# Patient Record
Sex: Male | Born: 1975
Health system: Southern US, Community
[De-identification: ages and names within clinical notes are randomized; demographics above are authoritative.]

## PROBLEM LIST (undated history)

## (undated) DIAGNOSIS — K5190 Ulcerative colitis, unspecified, without complications: Secondary | ICD-10-CM

## (undated) HISTORY — DX: Ulcerative colitis, unspecified, without complications: K51.90

---
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
## 1997-07-30 HISTORY — PX: WISDOM TOOTH EXTRACTION: SHX21

## 2015-02-24 ENCOUNTER — Ambulatory Visit (INDEPENDENT_AMBULATORY_CARE_PROVIDER_SITE_OTHER): Payer: Managed Care, Other (non HMO) | Admitting: Family Medicine

## 2015-02-24 ENCOUNTER — Encounter: Payer: Self-pay | Admitting: Family Medicine

## 2015-02-24 VITALS — BP 128/95 | HR 84 | Ht 75.0 in | Wt 243.0 lb

## 2015-02-24 DIAGNOSIS — N529 Male erectile dysfunction, unspecified: Secondary | ICD-10-CM

## 2015-02-24 DIAGNOSIS — M222X2 Patellofemoral disorders, left knee: Secondary | ICD-10-CM

## 2015-02-24 MED ORDER — MELOXICAM 15 MG PO TABS
15.0000 mg | ORAL_TABLET | Freq: Every day | ORAL | Status: DC
Start: 2015-02-24 — End: 2016-04-09

## 2015-02-24 MED ORDER — SILDENAFIL CITRATE 20 MG PO TABS: ORAL_TABLET | ORAL | Status: DC

## 2015-02-24 NOTE — Progress Notes (Signed)
CC: Charles Novak is a 39 y.o. male is here for Establish Care and left knee pain   Subjective: HPI:  Very pleasant 39 year old here to establish care   Complains of left knee pain present for the last 5 days. Present on a daily basis. It's worse when going from a seated to a standing position or when going up or down stairs. He tells me it feels like it's beneath the kneecap. It is nonradiating. He denies any recent or remote trauma or overexertion. He denies swelling redness or warmth of the joint. He denies locking catching or giving way. No interventions as of yet.  Complains of difficulty maintaining an erection that has been going on for a few months now. He denies any other genitourinary complaints. He has no difficulty with initiating erection however since he tries to put on a condom and gets limp and condom falls off. He denies exertional chest pain or burning when he urinates. He denies any change in libido.  He denies any penile lesions. No interventions as of yet  Review of Systems - General ROS: negative for - chills, fever, night sweats, weight gain or weight loss Ophthalmic ROS: negative for - decreased vision Psychological ROS: negative for - anxiety or depression ENT ROS: negative for - hearing change, nasal congestion, tinnitus or allergies Hematological and Lymphatic ROS: negative for - bleeding problems, bruising or swollen lymph nodes Breast ROS: negative Respiratory ROS: no cough, shortness of breath, or wheezing Cardiovascular ROS: no chest pain or dyspnea on exertion Gastrointestinal ROS: no abdominal pain, change in bowel habits, or black or bloody stools Genito-Urinary ROS: negative for - genital discharge, genital ulcers, incontinence or abnormal bleeding from genitals Musculoskeletal ROS: negative for - joint pain or muscle pain other than that described above Neurological ROS: negative for - headaches or memory loss Dermatological ROS: negative for lumps, mole  changes, rash and skin lesion changes  Past Medical History  Diagnosis Date  . Ulcerative colitis 1991    Past Surgical History  Procedure Laterality Date  . Wisdom tooth extraction  07/1997   Family History  Problem Relation Age of Onset  . Leukemia      grandmother   . Heart attack      grandfather  . Depression      brother   . Diabetes      parents  . Hyperlipidemia Father   . Hypertension Father     History   Social History  . Marital Status: Single    Spouse Name: N/A  . Number of Children: N/A  . Years of Education: N/A   Occupational History  . Not on file.   Social History Main Topics  . Smoking status: Former Smoker    Quit date: 04/25/2008  . Smokeless tobacco: Not on file  . Alcohol Use: 1.8 - 3.0 oz/week    3-5 Standard drinks or equivalent per week  . Drug Use: No  . Sexual Activity:    Partners: Female   Other Topics Concern  . Not on file   Social History Narrative  . No narrative on file     Objective: BP 128/95 mmHg  Pulse 84  Ht 6\' 3"  (1.905 m)  Wt 243 lb (110.224 kg)  BMI 30.37 kg/m2  General: Alert and Oriented, No Acute Distress HEENT: Pupils equal, round, reactive to light. Conjunctivae clear.  Moist mucous membranes Lungs: Clear to auscultation bilaterally, no wheezing/ronchi/rales.  Comfortable work of breathing. Good air movement. Cardiac: Regular rate  and rhythm. Normal S1/S2.  No murmurs, rubs, nor gallops.   Left knee exam shows full-strength and range of motion. There is no swelling, redness, nor warmth overlying the knee.  No patellar crepitus. No patellar apprehension. No pain with palpation of the inferior patellar pole.  No pain or laxity with valgus nor varus stress. Anterior drawer is negative. McMurray's negative. No popliteal space tenderness or palpable mass. No medial or lateral joint line tenderness to palpation. Extremities: No peripheral edema.  Strong peripheral pulses.  Mental Status: No depression, anxiety,  nor agitation. Skin: Warm and dry.  Assessment & Plan: Sherrod was seen today for establish care and left knee pain.  Diagnoses and all orders for this visit:  Patella-femoral syndrome, left Orders: -     meloxicam (MOBIC) 15 MG tablet; Take 1 tablet (15 mg total) by mouth daily.  Erectile dysfunction, unspecified erectile dysfunction type Orders: -     sildenafil (REVATIO) 20 MG tablet; 2-4 by mouth only as needed for sex   Patellofemoral syndrome of the left knee: Start meloxicam and begin home rehabilitation exercises that he was provided today, perform daily for at least three weeks. Erectile dysfunction: Begin generic Viagra, he was provided with a prescription and the address of [facility]  Return if symptoms worsen or fail to improve.

## 2015-02-27 ENCOUNTER — Other Ambulatory Visit: Payer: Self-pay | Admitting: Family Medicine

## 2015-02-27 DIAGNOSIS — K5190 Ulcerative colitis, unspecified, without complications: Secondary | ICD-10-CM | POA: Insufficient documentation

## 2015-09-08 ENCOUNTER — Other Ambulatory Visit: Payer: Self-pay | Admitting: Family Medicine

## 2015-09-08 LAB — CBC AND DIFFERENTIAL
Hemoglobin: 16.7 g/dL (ref 13.5–17.5)
Platelets: 228 10*3/uL (ref 150–399)
WBC: 8.4 10^3/mL

## 2015-09-08 LAB — HEPATIC FUNCTION PANEL
ALT: 32 U/L (ref 10–40)
AST: 34 U/L (ref 14–40)

## 2015-09-08 LAB — LIPID PANEL
CHOLESTEROL: 198 mg/dL (ref 0–200)
HDL: 22 mg/dL — AB (ref 35–70)
TRIGLYCERIDES: 609 mg/dL — AB (ref 40–160)

## 2015-09-08 LAB — BASIC METABOLIC PANEL
CREATININE: 1.1 mg/dL (ref 0.6–1.3)
Glucose: 96 mg/dL
POTASSIUM: 4.6 mmol/L (ref 3.4–5.3)

## 2015-09-10 ENCOUNTER — Encounter: Payer: Self-pay | Admitting: Family Medicine

## 2015-09-10 DIAGNOSIS — E785 Hyperlipidemia, unspecified: Secondary | ICD-10-CM | POA: Insufficient documentation

## 2015-09-24 ENCOUNTER — Encounter: Payer: Self-pay | Admitting: Family Medicine

## 2016-04-09 ENCOUNTER — Ambulatory Visit (INDEPENDENT_AMBULATORY_CARE_PROVIDER_SITE_OTHER): Payer: BLUE CROSS/BLUE SHIELD

## 2016-04-09 ENCOUNTER — Ambulatory Visit (INDEPENDENT_AMBULATORY_CARE_PROVIDER_SITE_OTHER): Payer: BLUE CROSS/BLUE SHIELD | Admitting: Family Medicine

## 2016-04-09 ENCOUNTER — Encounter: Payer: Self-pay | Admitting: Family Medicine

## 2016-04-09 VITALS — BP 123/82 | HR 90 | Wt 247.0 lb

## 2016-04-09 DIAGNOSIS — G5752 Tarsal tunnel syndrome, left lower limb: Secondary | ICD-10-CM

## 2016-04-09 DIAGNOSIS — M25572 Pain in left ankle and joints of left foot: Secondary | ICD-10-CM

## 2016-04-09 DIAGNOSIS — M25872 Other specified joint disorders, left ankle and foot: Secondary | ICD-10-CM

## 2016-04-09 NOTE — Progress Notes (Signed)
CC: Charles Novak is a 40 y.o. male is here for Foot Pain (left foot and ankle pain)   Subjective: HPI:  Ever since March of this year he's been experiencing some left lateral ankle pain. It was at its worst is March and he was seen at a urgent care center and was diagnosed with gout. He questions this diagnosis. Improved temporarily with prednisone. Slowly coming back. It hurts to bear weight and with eversion. He denies any known trauma or overexertion. Occasionally notices swelling but no redness. Has never had this before. Denies any other joint pain. Has no other complaints today. Symptoms are persistent and mild to moderate in severity.   Review Of Systems Outlined In HPI  Past Medical History:  Diagnosis Date  . Ulcerative colitis 1991    Past Surgical History:  Procedure Laterality Date  . WISDOM TOOTH EXTRACTION  07/1997   Family History  Problem Relation Age of Onset  . Leukemia      grandmother   . Heart attack      grandfather  . Depression      brother   . Diabetes      parents  . Hyperlipidemia Father   . Hypertension Father     Social History   Social History  . Marital status: Single    Spouse name: N/A  . Number of children: N/A  . Years of education: N/A   Occupational History  . Not on file.   Social History Main Topics  . Smoking status: Former Smoker    Quit date: 04/25/2008  . Smokeless tobacco: Not on file  . Alcohol use 1.8 - 3.0 oz/week    3 - 5 Standard drinks or equivalent per week  . Drug use: No  . Sexual activity: Not Currently    Partners: Female   Other Topics Concern  . Not on file   Social History Narrative  . No narrative on file     Objective: BP 123/82   Pulse (!) 120   Wt 247 lb (112 kg)   BMI 30.87 kg/m   Vital signs reviewed. General: Alert and Oriented, No Acute Distress HEENT: Pupils equal, round, reactive to light. Conjunctivae clear.  External ears unremarkable.  Moist mucous membranes. Lungs: Clear and  comfortable work of breathing, speaking in full sentences without accessory muscle use. Cardiac: Regular rate and rhythm.  Neuro: CN II-XII grossly intact, gait normal. Extremities: No peripheral edema.  Strong peripheral pulses.  Left foot exam: No pain at the medial or lateral malleoli. He does have a little bit of pain reproduced with palpation just posterior to the ATF, no pain with passive inversion however pain is reproduced with passive eversion. No pain of the Achilles tendon nor at the insertion of the calcaneus Mental Status: No depression, anxiety, nor agitation. Logical though process. Skin: Warm and dry.    Assessment & Plan: Quamir was seen today for foot pain.  Diagnoses and all orders for this visit:  Left ankle pain -     DG Ankle Complete Left    Personal interpretation of his x-rays show a bone cyst above the left lateral malleolus. This is far from where he is having his pain is unlikely causing his pain. Radiology has recommended an MRI of clinically indicated, at this point I don't think we need to get the MRI and I would rather him try a trial of custom orthotics from one of our sports medicine physicians.   No Follow-up on file.

## 2016-04-16 ENCOUNTER — Ambulatory Visit (INDEPENDENT_AMBULATORY_CARE_PROVIDER_SITE_OTHER): Payer: BLUE CROSS/BLUE SHIELD | Admitting: Sports Medicine

## 2016-04-16 DIAGNOSIS — G5752 Tarsal tunnel syndrome, left lower limb: Secondary | ICD-10-CM | POA: Diagnosis not present

## 2016-04-16 DIAGNOSIS — M25572 Pain in left ankle and joints of left foot: Secondary | ICD-10-CM | POA: Insufficient documentation

## 2016-04-16 MED ORDER — MELOXICAM 15 MG PO TABS: ORAL_TABLET | ORAL | 3 refills | Status: DC

## 2016-04-16 NOTE — Assessment & Plan Note (Signed)
Custom orthotics as above, return in one month to recheck, consider talocrural injection of no better.

## 2016-04-16 NOTE — Progress Notes (Signed)

## 2016-04-16 NOTE — Addendum Note (Signed)
Addended by: Silverio Decamp on: 04/16/2016 10:16 AM   Modules accepted: Orders

## 2016-05-17 ENCOUNTER — Other Ambulatory Visit: Payer: Self-pay

## 2016-05-17 MED ORDER — MELOXICAM 15 MG PO TABS: ORAL_TABLET | ORAL | 1 refills | Status: DC

## 2016-07-29 ENCOUNTER — Ambulatory Visit (INDEPENDENT_AMBULATORY_CARE_PROVIDER_SITE_OTHER): Payer: BLUE CROSS/BLUE SHIELD | Admitting: Family Medicine

## 2016-07-29 ENCOUNTER — Encounter: Payer: Self-pay | Admitting: Family Medicine

## 2016-07-29 VITALS — BP 120/77 | HR 99 | Wt 254.0 lb

## 2016-07-29 DIAGNOSIS — M25572 Pain in left ankle and joints of left foot: Secondary | ICD-10-CM

## 2016-07-29 MED ORDER — COLCHICINE 0.6 MG PO TABS: 0.6000 mg | ORAL_TABLET | Freq: Every day | ORAL | 1 refills | Status: DC

## 2016-07-29 MED ORDER — DICLOFENAC SODIUM 1 % TD GEL: 2.0000 g | Freq: Four times a day (QID) | TRANSDERMAL | 11 refills | Status: DC

## 2016-07-29 NOTE — Progress Notes (Signed)
Basheer Ragucci is a 40 y.o. male who presents to [facility] today for left ankle pain and swelling.  He had acute onset left ankle pain and swelling yesterday morning that got better over the course of the day but recurred this morning. He went hiking a week ago but denies trauma to the joint or injuries during that time. No fevers.  He has had 3 total episodes of left ankle pain and swelling since March. Seen by urgent care in March and treated for gout with corticosteroids, after which symptoms resolved. Seen by Dr. Darene Lamer for recurrence in August, diagnosed with sinus tarsi syndrome, and given orthotics.   Past Medical History:  Diagnosis Date  . Ulcerative colitis ([place]) 1991   Past Surgical History:  Procedure Laterality Date  . WISDOM TOOTH EXTRACTION  07/1997   Social History  Substance Use Topics  . Smoking status: Former Smoker    Quit date: 04/25/2008  . Smokeless tobacco: Not on file  . Alcohol use 1.8 - 3.0 oz/week    3 - 5 Standard drinks or equivalent per week     ROS:  As above   Medications: Current Outpatient Prescriptions  Medication Sig Dispense Refill  . balsalazide (COLAZAL) 750 MG capsule Take 3 capsules (2,250 mg total) by mouth 3 (three) times daily.    . colchicine 0.6 MG tablet Take 1 tablet (0.6 mg total) by mouth daily. 30 tablet 1  . diclofenac sodium (VOLTAREN) 1 % GEL Apply 2 g topically 4 (four) times daily. To affected joint. 100 g 11  . meloxicam (MOBIC) 15 MG tablet One tab PO qAM with breakfast for 2 weeks, then daily prn pain. 90 tablet 1  . pantoprazole (PROTONIX) 40 MG tablet Take 1 tablet (40 mg total) by mouth daily. 30 tablet 3  . sildenafil (REVATIO) 20 MG tablet 2-4 by mouth only as needed for sex 50 tablet 0   No current facility-administered medications for this visit.    No Known Allergies   Exam:  BP 120/77   Pulse 99   Wt 254 lb (115.2 kg)   BMI 31.75 kg/m  General: Well  Developed, well nourished, and in no acute distress.  Neuro/Psych: Alert and oriented x3, extra-ocular muscles intact, able to move all 4 extremities, sensation grossly intact. Skin: Warm and dry, no rashes noted.  Respiratory: Not using accessory muscles, speaking in full sentences, trachea midline.  Cardiovascular: Pulses palpable, no extremity edema. Abdomen: Does not appear distended. MSK: Left ankle: Mild-to-moderate swelling around the lateral malleolus. Tenderness to palpation anterior to lateral malleolus. Full ROM Pain on active inversion and eversion. Pain on weight bearing.  Ultrasound of lateral left ankle joint: Hypoechoic fluid extending from the talofibular joint area. Small amount present. Hypoechoic fluid surrounding the perineal tinea and she is just inferior to the lateral malleolus mildly tender to palpation with the ultrasound probe. No visible tendon disruption present. Bony structures are otherwise normal. Impression: Small amount of joint effusion and mild tenosynovitis of the peroneal tendons   No results found for this or any previous visit (from the past 48 hour(s)). No results found.   Assessment and Plan: 40 y.o. male with recurrent, nontraumatic episodes of left ankle pain and swelling. Ultrasound shows smalls amount of fluid in both the ankle joint and around the peroneus  tendons. Pain may be due to gout, sinus tarsi syndrome, peroneus brevis tendinopathy, or a combination of these things.  Conservative treatment of possible tendinopathy /  sinus tarsi syndrome:  - voltaren gel, PT - if no improvement, consider steroid injection.  Treatment of possible gout & further workup:  - colchicine for 1 week  - labs as below  Additionally we'll use a cam walker boot for comfort as patient will be ambulating a great deal over the next few days.  Orders Placed This Encounter  Procedures  . CBC  . COMPLETE METABOLIC PANEL WITH GFR  . Uric acid  . ANA  .  Sedimentation rate  . Rheumatoid factor  . Ambulatory referral to Physical Therapy    Referral Priority:   Routine    Referral Type:   Physical Medicine    Referral Reason:   Specialty Services Required    Requested Specialty:   Physical Therapy    Number of Visits Requested:   1    Discussed warning signs or symptoms. Please see discharge instructions. Patient expresses understanding.   This patient was seen and interviewed and examined independently by myself. Lynne Leader, MD

## 2016-07-29 NOTE — Patient Instructions (Addendum)
Thank you for coming in today. Attend PT.  Use the boot as needed.  Get labs today.  Return in a few weeks.    Gout Gout is painful swelling that can occur in some of your joints. Gout is a type of arthritis. This condition is caused by having too much uric acid in your body. Uric acid is a chemical that forms when your body breaks down substances called purines. Purines are important for building body proteins. When your body has too much uric acid, sharp crystals can form and build up inside your joints. This causes pain and swelling. Gout attacks can happen quickly and be very painful (acute gout). Over time, the attacks can affect more joints and become more frequent (chronic gout). Gout can also cause uric acid to build up under your skin and inside your kidneys. What are the causes? This condition is caused by too much uric acid in your blood. This can occur because:  Your kidneys do not remove enough uric acid from your blood. This is the most common cause.  Your body makes too much uric acid. This can occur with some cancers and cancer treatments. It can also occur if your body is breaking down too many red blood cells (hemolytic anemia).  You eat too many foods that are high in purines. These foods include organ meats and some seafood. Alcohol, especially beer, is also high in purines. A gout attack may be triggered by trauma or stress. What increases the risk? This condition is more likely to develop in people who:  Have a family history of gout.  Are male and middle-aged.  Are male and have gone through menopause.  Are obese.  Frequently drink alcohol, especially beer.  Are dehydrated.  Lose weight too quickly.  Have an organ transplant.  Have lead poisoning.  Take certain medicines, including aspirin, cyclosporine, diuretics, levodopa, and niacin.  Have kidney disease or psoriasis. What are the signs or symptoms? An attack of acute gout happens quickly. It  usually occurs in just one joint. The most common place is the big toe. Attacks often start at night. Other joints that may be affected include joints of the feet, ankle, knee, fingers, wrist, or elbow. Symptoms may include:  Severe pain.  Warmth.  Swelling.  Stiffness.  Tenderness. The affected joint may be very painful to touch.  Shiny, red, or purple skin.  Chills and fever. Chronic gout may cause symptoms more frequently. More joints may be involved. You may also have white or yellow lumps (tophi) on your hands or feet or in other areas near your joints. How is this diagnosed? This condition is diagnosed based on your symptoms, medical history, and physical exam. You may have tests, such as:  Blood tests to measure uric acid levels.  Removal of joint fluid with a needle (aspiration) to look for uric acid crystals.  X-rays to look for joint damage. How is this treated? Treatment for this condition has two phases: treating an acute attack and preventing future attacks. Acute gout treatment may include medicines to reduce pain and swelling, including:  NSAIDs.  Steroids. These are strong anti-inflammatory medicines that can be taken by mouth (orally) or injected into a joint.  Colchicine. This medicine relieves pain and swelling when it is taken soon after an attack. It can be given orally or through an IV tube. Preventive treatment may include:  Daily use of smaller doses of NSAIDs or colchicine.  Use of a medicine that reduces  uric acid levels in your blood.  Changes to your diet. You may need to see a specialist about healthy eating (dietitian). Follow these instructions at home: During a Gout Attack  If directed, apply ice to the affected area:  Put ice in a plastic bag.  Place a towel between your skin and the bag.  Leave the ice on for 20 minutes, 2-3 times a day.  Rest the joint as much as possible. If the affected joint is in your leg, you may be given  crutches to use.  Raise (elevate) the affected joint above the level of your heart as often as possible.  Drink enough fluids to keep your urine clear or pale yellow.  Take over-the-counter and prescription medicines only as told by your health care provider.  Do not drive or operate heavy machinery while taking prescription pain medicine.  Follow instructions from your health care provider about eating or drinking restrictions.  Return to your normal activities as told by your health care provider. Ask your health care provider what activities are safe for you. Avoiding Future Gout Attacks  Follow a low-purine diet as told by your dietitian or health care provider. Avoid foods and drinks that are high in purines, including liver, kidney, anchovies, asparagus, herring, mushrooms, mussels, and beer.  Limit alcohol intake to no more than 1 drink a day for nonpregnant women and 2 drinks a day for men. One drink equals 12 oz of beer, 5 oz of wine, or 1 oz of hard liquor.  Maintain a healthy weight or lose weight if you are overweight. If you want to lose weight, talk with your health care provider. It is important that you do not lose weight too quickly.  Start or maintain an exercise program as told by your health care provider.  Drink enough fluids to keep your urine clear or pale yellow.  Take over-the-counter and prescription medicines only as told by your health care provider.  Keep all follow-up visits as told by your health care provider. This is important. Contact a health care provider if:  You have another gout attack.  You continue to have symptoms of a gout attack after10 days of treatment.  You have side effects from your medicines.  You have chills or a fever.  You have burning pain when you urinate.  You have pain in your lower back or belly. Get help right away if:  You have severe or uncontrolled pain.  You cannot urinate. This information is not intended  to replace advice given to you by your health care provider. Make sure you discuss any questions you have with your health care provider. Document Released: 08/13/2000 Document Revised: 01/22/2016 Document Reviewed: 05/29/2015 Elsevier Interactive Patient Education  2017 [employer].

## 2016-07-30 ENCOUNTER — Encounter: Payer: Self-pay | Admitting: Family Medicine

## 2016-07-30 DIAGNOSIS — R7401 Elevation of levels of liver transaminase levels: Secondary | ICD-10-CM | POA: Insufficient documentation

## 2016-07-30 DIAGNOSIS — M1A9XX0 Chronic gout, unspecified, without tophus (tophi): Secondary | ICD-10-CM | POA: Insufficient documentation

## 2016-07-30 DIAGNOSIS — R740 Nonspecific elevation of levels of transaminase and lactic acid dehydrogenase [LDH]: Secondary | ICD-10-CM

## 2016-07-30 LAB — CBC
HCT: 45.3 % (ref 38.5–50.0)
HEMOGLOBIN: 15.6 g/dL (ref 13.2–17.1)
MCH: 29 pg (ref 27.0–33.0)
MCHC: 34.4 g/dL (ref 32.0–36.0)
MCV: 84.2 fL (ref 80.0–100.0)
MPV: 10.1 fL (ref 7.5–12.5)
Platelets: 206 10*3/uL (ref 140–400)
RBC: 5.38 MIL/uL (ref 4.20–5.80)
RDW: 14.2 % (ref 11.0–15.0)
WBC: 9.4 10*3/uL (ref 3.8–10.8)

## 2016-07-30 LAB — COMPLETE METABOLIC PANEL WITHOUT GFR
ALT: 51 U/L — ABNORMAL HIGH (ref 9–46)
AST: 79 U/L — ABNORMAL HIGH (ref 10–40)
Albumin: 4.4 g/dL (ref 3.6–5.1)
Alkaline Phosphatase: 62 U/L (ref 40–115)
BUN: 17 mg/dL (ref 7–25)
CO2: 25 mmol/L (ref 20–31)
Calcium: 9.5 mg/dL (ref 8.6–10.3)
Chloride: 103 mmol/L (ref 98–110)
Creat: 1.3 mg/dL (ref 0.60–1.35)
GFR, Est African American: 79 mL/min
GFR, Est Non African American: 68 mL/min
Glucose, Bld: 106 mg/dL — ABNORMAL HIGH (ref 65–99)
Potassium: 4.1 mmol/L (ref 3.5–5.3)
Sodium: 138 mmol/L (ref 135–146)
Total Bilirubin: 0.6 mg/dL (ref 0.2–1.2)
Total Protein: 7.3 g/dL (ref 6.1–8.1)

## 2016-07-30 LAB — RHEUMATOID FACTOR: Rhuematoid fact SerPl-aCnc: <14 IU/mL (ref <14)

## 2016-07-30 LAB — SEDIMENTATION RATE: Sed Rate: 6 mm/hr (ref 0–15)

## 2016-07-30 LAB — URIC ACID: Uric Acid, Serum: 9.8 mg/dL — ABNORMAL HIGH (ref 4.0–8.0)

## 2016-08-02 LAB — ANA: Anti Nuclear Antibody(ANA): NEGATIVE

## 2016-09-06 ENCOUNTER — Ambulatory Visit (INDEPENDENT_AMBULATORY_CARE_PROVIDER_SITE_OTHER): Payer: BLUE CROSS/BLUE SHIELD | Admitting: Family Medicine

## 2016-09-06 ENCOUNTER — Encounter: Payer: Self-pay | Admitting: Family Medicine

## 2016-09-06 VITALS — BP 128/91 | HR 109 | Wt 249.0 lb

## 2016-09-06 DIAGNOSIS — M1A0720 Idiopathic chronic gout, left ankle and foot, without tophus (tophi): Secondary | ICD-10-CM

## 2016-09-06 DIAGNOSIS — M25572 Pain in left ankle and joints of left foot: Secondary | ICD-10-CM

## 2016-09-06 MED ORDER — ALLOPURINOL 300 MG PO TABS: 300.0000 mg | ORAL_TABLET | Freq: Every day | ORAL | 1 refills | Status: DC

## 2016-09-06 MED ORDER — COLCHICINE 0.6 MG PO TABS: 0.6000 mg | ORAL_TABLET | Freq: Every day | ORAL | 0 refills | Status: DC

## 2016-09-06 NOTE — Patient Instructions (Signed)
Thank you for coming in today. START allopurinol.  Continue colchicine daily for 3 months.  Recheck in 3 months.  Return sooner if needed.    Gout Gout is painful swelling that can occur in some of your joints. Gout is a type of arthritis. This condition is caused by having too much uric acid in your body. Uric acid is a chemical that forms when your body breaks down substances called purines. Purines are important for building body proteins. When your body has too much uric acid, sharp crystals can form and build up inside your joints. This causes pain and swelling. Gout attacks can happen quickly and be very painful (acute gout). Over time, the attacks can affect more joints and become more frequent (chronic gout). Gout can also cause uric acid to build up under your skin and inside your kidneys. What are the causes? This condition is caused by too much uric acid in your blood. This can occur because:  Your kidneys do not remove enough uric acid from your blood. This is the most common cause.  Your body makes too much uric acid. This can occur with some cancers and cancer treatments. It can also occur if your body is breaking down too many red blood cells (hemolytic anemia).  You eat too many foods that are high in purines. These foods include organ meats and some seafood. Alcohol, especially beer, is also high in purines. A gout attack may be triggered by trauma or stress. What increases the risk? This condition is more likely to develop in people who:  Have a family history of gout.  Are male and middle-aged.  Are male and have gone through menopause.  Are obese.  Frequently drink alcohol, especially beer.  Are dehydrated.  Lose weight too quickly.  Have an organ transplant.  Have lead poisoning.  Take certain medicines, including aspirin, cyclosporine, diuretics, levodopa, and niacin.  Have kidney disease or psoriasis. What are the signs or symptoms? An attack of  acute gout happens quickly. It usually occurs in just one joint. The most common place is the big toe. Attacks often start at night. Other joints that may be affected include joints of the feet, ankle, knee, fingers, wrist, or elbow. Symptoms may include:  Severe pain.  Warmth.  Swelling.  Stiffness.  Tenderness. The affected joint may be very painful to touch.  Shiny, red, or purple skin.  Chills and fever. Chronic gout may cause symptoms more frequently. More joints may be involved. You may also have white or yellow lumps (tophi) on your hands or feet or in other areas near your joints. How is this diagnosed? This condition is diagnosed based on your symptoms, medical history, and physical exam. You may have tests, such as:  Blood tests to measure uric acid levels.  Removal of joint fluid with a needle (aspiration) to look for uric acid crystals.  X-rays to look for joint damage. How is this treated? Treatment for this condition has two phases: treating an acute attack and preventing future attacks. Acute gout treatment may include medicines to reduce pain and swelling, including:  NSAIDs.  Steroids. These are strong anti-inflammatory medicines that can be taken by mouth (orally) or injected into a joint.  Colchicine. This medicine relieves pain and swelling when it is taken soon after an attack. It can be given orally or through an IV tube. Preventive treatment may include:  Daily use of smaller doses of NSAIDs or colchicine.  Use of a medicine that  reduces uric acid levels in your blood.  Changes to your diet. You may need to see a specialist about healthy eating (dietitian). Follow these instructions at home: During a Gout Attack  If directed, apply ice to the affected area:  Put ice in a plastic bag.  Place a towel between your skin and the bag.  Leave the ice on for 20 minutes, 2-3 times a day.  Rest the joint as much as possible. If the affected joint is in  your leg, you may be given crutches to use.  Raise (elevate) the affected joint above the level of your heart as often as possible.  Drink enough fluids to keep your urine clear or pale yellow.  Take over-the-counter and prescription medicines only as told by your health care provider.  Do not drive or operate heavy machinery while taking prescription pain medicine.  Follow instructions from your health care provider about eating or drinking restrictions.  Return to your normal activities as told by your health care provider. Ask your health care provider what activities are safe for you. Avoiding Future Gout Attacks  Follow a low-purine diet as told by your dietitian or health care provider. Avoid foods and drinks that are high in purines, including liver, kidney, anchovies, asparagus, herring, mushrooms, mussels, and beer.  Limit alcohol intake to no more than 1 drink a day for nonpregnant women and 2 drinks a day for men. One drink equals 12 oz of beer, 5 oz of wine, or 1 oz of hard liquor.  Maintain a healthy weight or lose weight if you are overweight. If you want to lose weight, talk with your health care provider. It is important that you do not lose weight too quickly.  Start or maintain an exercise program as told by your health care provider.  Drink enough fluids to keep your urine clear or pale yellow.  Take over-the-counter and prescription medicines only as told by your health care provider.  Keep all follow-up visits as told by your health care provider. This is important. Contact a health care provider if:  You have another gout attack.  You continue to have symptoms of a gout attack after10 days of treatment.  You have side effects from your medicines.  You have chills or a fever.  You have burning pain when you urinate.  You have pain in your lower back or belly. Get help right away if:  You have severe or uncontrolled pain.  You cannot urinate. This  information is not intended to replace advice given to you by your health care provider. Make sure you discuss any questions you have with your health care provider. Document Released: 08/13/2000 Document Revised: 01/22/2016 Document Reviewed: 05/29/2015 Elsevier Interactive Patient Education  2017 [employer].

## 2016-09-06 NOTE — Progress Notes (Signed)
   Charles Novak is a 41 y.o. male who presents to [facility] today for follow up left ankle pain.  Uric acid from last visit 1 month ago was 9.2, and he has been taking colchicine daily since then. Tried voltaren gel briefly, did not go to PT when he found out the uric acid results. He has intermittent episodes of discomfort in his left ankle, but his symptoms have generally resolved and his ankle no longer bothers him. He has had no further episodes of fever, pain, or swelling.   Past Medical History:  Diagnosis Date  . Ulcerative colitis ([place]) 1991   Past Surgical History:  Procedure Laterality Date  . WISDOM TOOTH EXTRACTION  07/1997   Social History  Substance Use Topics  . Smoking status: Former Smoker    Quit date: 04/25/2008  . Smokeless tobacco: Not on file  . Alcohol use 1.8 - 3.0 oz/week    3 - 5 Standard drinks or equivalent per week     ROS:  As above   Medications: Current Outpatient Prescriptions  Medication Sig Dispense Refill  . balsalazide (COLAZAL) 750 MG capsule Take 3 capsules (2,250 mg total) by mouth 3 (three) times daily.    . colchicine 0.6 MG tablet Take 1 tablet (0.6 mg total) by mouth daily. 90 tablet 0  . diclofenac sodium (VOLTAREN) 1 % GEL Apply 2 g topically 4 (four) times daily. To affected joint. 100 g 11  . meloxicam (MOBIC) 15 MG tablet One tab PO qAM with breakfast for 2 weeks, then daily prn pain. 90 tablet 1  . pantoprazole (PROTONIX) 40 MG tablet Take 1 tablet (40 mg total) by mouth daily. 30 tablet 3  . sildenafil (REVATIO) 20 MG tablet 2-4 by mouth only as needed for sex 50 tablet 0  . allopurinol (ZYLOPRIM) 300 MG tablet Take 1 tablet (300 mg total) by mouth daily. 90 tablet 1   No current facility-administered medications for this visit.    No Known Allergies   Exam:  BP (!) 128/91   Pulse (!) 109   Wt 249 lb (112.9 kg)   BMI 31.12 kg/m  General: Well Developed, well nourished,  and in no acute distress.  Neuro/Psych: Alert and oriented x3, extra-ocular muscles intact, able to move all 4 extremities, sensation grossly intact. Skin: Warm and dry, no rashes noted.  Respiratory: Not using accessory muscles, speaking in full sentences, trachea midline.  Cardiovascular: Pulses palpable, no extremity edema. Abdomen: Does not appear distended. MSK: Left foot without edema or erythema. Nontender to palpation throughout, full ROM.    No results found for this or any previous visit (from the past 48 hour(s)). No results found.  Lab Results  Component Value Date   LABURIC 9.8 (H) 07/29/2016     Assessment and Plan: 41 y.o. male with left ankle pain associated with elevated uric acid that resolved with colchicine, suggestive of gout. Continue colchicine for 3 months and start allopurinol. Discussed diet modifications. Follow up in 3 months.  BP is mildly elevated today. Continue to follow.  No orders of the defined types were placed in this encounter.   Discussed warning signs or symptoms. Please see discharge instructions. Patient expresses understanding.

## 2016-12-06 ENCOUNTER — Ambulatory Visit (INDEPENDENT_AMBULATORY_CARE_PROVIDER_SITE_OTHER): Payer: BLUE CROSS/BLUE SHIELD | Admitting: Family Medicine

## 2016-12-06 ENCOUNTER — Encounter: Payer: Self-pay | Admitting: Family Medicine

## 2016-12-06 VITALS — BP 126/95 | HR 122 | Wt 246.0 lb

## 2016-12-06 DIAGNOSIS — M1A00X0 Idiopathic chronic gout, unspecified site, without tophus (tophi): Secondary | ICD-10-CM

## 2016-12-06 DIAGNOSIS — Z114 Encounter for screening for human immunodeficiency virus [HIV]: Secondary | ICD-10-CM

## 2016-12-06 DIAGNOSIS — E782 Mixed hyperlipidemia: Secondary | ICD-10-CM | POA: Diagnosis not present

## 2016-12-06 DIAGNOSIS — R000 Tachycardia, unspecified: Secondary | ICD-10-CM

## 2016-12-06 DIAGNOSIS — R740 Nonspecific elevation of levels of transaminase and lactic acid dehydrogenase [LDH]: Secondary | ICD-10-CM

## 2016-12-06 DIAGNOSIS — R739 Hyperglycemia, unspecified: Secondary | ICD-10-CM | POA: Diagnosis not present

## 2016-12-06 DIAGNOSIS — R7401 Elevation of levels of liver transaminase levels: Secondary | ICD-10-CM

## 2016-12-06 NOTE — Patient Instructions (Signed)
Thank you for coming in today. Get fasting labs in the near future.  Recheck in  6 months.  We may change allopurinol doses based on labs.  Use colchicine as needed for gout flairs.  My goal is for you to not have any flairs.

## 2016-12-06 NOTE — Progress Notes (Signed)
Charles Novak is a 41 y.o. male who presents to [place]: [place] today for follow-up on gout.  He has not had any flare-ups since last visit.  He stopped taking the colchicine and continues to take the allopurinol.   He is tachycardic at today's visit.  This is consistent with prior visits.  He uses an apple watch and says his resting heart rate is typically in the 80s when he isn't anxious about being at the doctors office.  He is able to climb up to 4 flights of stairs.     Past Medical History:  Diagnosis Date  . Ulcerative colitis ([place]) 1991   Past Surgical History:  Procedure Laterality Date  . WISDOM TOOTH EXTRACTION  07/1997   Social History  Substance Use Topics  . Smoking status: Former Smoker    Quit date: 04/25/2008  . Smokeless tobacco: Never Used  . Alcohol use 1.8 - 3.0 oz/week    3 - 5 Standard drinks or equivalent per week   family history includes Hyperlipidemia in his father; Hypertension in his father.  ROS as above:  Medications: Current Outpatient Prescriptions  Medication Sig Dispense Refill  . allopurinol (ZYLOPRIM) 300 MG tablet Take 1 tablet (300 mg total) by mouth daily. 90 tablet 1  . balsalazide (COLAZAL) 750 MG capsule Take 3 capsules (2,250 mg total) by mouth 3 (three) times daily.    . colchicine 0.6 MG tablet Take 1 tablet (0.6 mg total) by mouth daily. 90 tablet 0  . diclofenac sodium (VOLTAREN) 1 % GEL Apply 2 g topically 4 (four) times daily. To affected joint. 100 g 11  . meloxicam (MOBIC) 15 MG tablet One tab PO qAM with breakfast for 2 weeks, then daily prn pain. 90 tablet 1  . pantoprazole (PROTONIX) 40 MG tablet Take 1 tablet (40 mg total) by mouth daily. 30 tablet 3  . sildenafil (REVATIO) 20 MG tablet 2-4 by mouth only as needed for sex 50 tablet 0   No current facility-administered medications for this visit.    No Known  Allergies  Health Maintenance Health Maintenance  Topic Date Due  . HIV Screening  06/19/1991  . TETANUS/TDAP  06/19/1995  . INFLUENZA VACCINE  03/30/2017     Exam:  BP (!) 126/95   Pulse (!) 122   Wt 246 lb (111.6 kg)   BMI 30.75 kg/m  Gen: Well NAD HEENT: EOMI,  MMM Lungs: Normal work of breathing. CTABL Heart: Sinus Tachycardia, no MRG Abd: NABS, Soft. Nondistended, Nontender Exts: Brisk capillary refill, warm and well perfused.   12 lead EKG: NSR at 98 bpm. No ST elevation. No q waves. Normal axis. Normal EKG.    No results found for this or any previous visit (from the past 72 hour(s)). No results found.    Assessment and Plan: 41 y.o. male with gout and tachycardia  Gout:  Well-controlled on allopurinol, re-check serum uric acid levels after prolonged time on medication.  Patient instructed to take colchicine as needed for flare-ups.    Tachycardia: EKG preformed in office, normal sinus tachycardia.  TSH, CBC order to work-up tachy.  Possibly related to white-coat anxiety      Orders Placed This Encounter  Procedures  . Uric acid  . COMPLETE METABOLIC PANEL WITH GFR  . Lipid Panel w/reflex Direct LDL  . TSH  . Hemoglobin A1c  . CBC  . HIV antibody   No orders of the  defined types were placed in this encounter.    Discussed warning signs or symptoms. Please see discharge instructions. Patient expresses understanding.

## 2016-12-07 LAB — CBC
HCT: 46 % (ref 38.5–50.0)
Hemoglobin: 16.1 g/dL (ref 13.2–17.1)
MCH: 29.9 pg (ref 27.0–33.0)
MCHC: 35 g/dL (ref 32.0–36.0)
MCV: 85.3 fL (ref 80.0–100.0)
MPV: 9.8 fL (ref 7.5–12.5)
PLATELETS: 182 10*3/uL (ref 140–400)
RBC: 5.39 MIL/uL (ref 4.20–5.80)
RDW: 14.3 % (ref 11.0–15.0)
WBC: 7.4 10*3/uL (ref 3.8–10.8)

## 2016-12-08 ENCOUNTER — Encounter: Payer: Self-pay | Admitting: Family Medicine

## 2016-12-08 LAB — COMPLETE METABOLIC PANEL WITH GFR
ALT: 75 U/L — ABNORMAL HIGH (ref 9–46)
AST: 89 U/L — ABNORMAL HIGH (ref 10–40)
Albumin: 4.5 g/dL (ref 3.6–5.1)
Alkaline Phosphatase: 63 U/L (ref 40–115)
BUN: 22 mg/dL (ref 7–25)
CALCIUM: 9.2 mg/dL (ref 8.6–10.3)
CHLORIDE: 104 mmol/L (ref 98–110)
CO2: 22 mmol/L (ref 20–31)
Creat: 1.14 mg/dL (ref 0.60–1.35)
GFR, EST NON AFRICAN AMERICAN: 80 mL/min (ref >=60)
Glucose, Bld: 112 mg/dL — ABNORMAL HIGH (ref 65–99)
POTASSIUM: 4.3 mmol/L (ref 3.5–5.3)
Sodium: 139 mmol/L (ref 135–146)
Total Bilirubin: 0.6 mg/dL (ref 0.2–1.2)
Total Protein: 7 g/dL (ref 6.1–8.1)

## 2016-12-08 LAB — LIPID PANEL W/REFLEX DIRECT LDL
CHOL/HDL RATIO: 8.6 ratio — AB (ref <5.0)
CHOLESTEROL: 197 mg/dL (ref <200)
HDL: 23 mg/dL — AB (ref >40)
NON-HDL CHOLESTEROL (CALC): 174 mg/dL — AB (ref <130)
TRIGLYCERIDES: 426 mg/dL — AB (ref <150)

## 2016-12-08 LAB — LDL CHOLESTEROL, DIRECT: Direct LDL: 79 mg/dL (ref <130)

## 2016-12-08 LAB — HIV ANTIBODY (ROUTINE TESTING W REFLEX): HIV 1&2 Ab, 4th Generation: NONREACTIVE

## 2016-12-08 LAB — HEMOGLOBIN A1C
Hgb A1c MFr Bld: 5.1 % (ref <5.7)
Mean Plasma Glucose: 100 mg/dL

## 2016-12-08 LAB — TSH: TSH: 1.46 mIU/L (ref 0.40–4.50)

## 2016-12-08 LAB — URIC ACID: Uric Acid, Serum: 7.9 mg/dL (ref 4.0–8.0)

## 2016-12-08 MED ORDER — ALLOPURINOL 300 MG PO TABS: 450.0000 mg | ORAL_TABLET | Freq: Every day | ORAL | 1 refills | Status: DC

## 2016-12-08 NOTE — Addendum Note (Signed)
Addended by: Gregor Hams on: 12/08/2016 07:33 AM   Modules accepted: Orders

## 2016-12-13 ENCOUNTER — Ambulatory Visit (INDEPENDENT_AMBULATORY_CARE_PROVIDER_SITE_OTHER): Payer: BLUE CROSS/BLUE SHIELD

## 2016-12-13 DIAGNOSIS — R740 Nonspecific elevation of levels of transaminase and lactic acid dehydrogenase [LDH]: Secondary | ICD-10-CM

## 2016-12-13 DIAGNOSIS — R7401 Elevation of levels of liver transaminase levels: Secondary | ICD-10-CM

## 2017-02-09 ENCOUNTER — Other Ambulatory Visit: Payer: Self-pay | Admitting: Sports Medicine

## 2017-03-04 ENCOUNTER — Other Ambulatory Visit: Payer: Self-pay | Admitting: Family Medicine

## 2017-04-13 DIAGNOSIS — K210 Gastro-esophageal reflux disease with esophagitis: Secondary | ICD-10-CM | POA: Diagnosis not present

## 2017-04-13 DIAGNOSIS — K5190 Ulcerative colitis, unspecified, without complications: Secondary | ICD-10-CM | POA: Diagnosis not present

## 2017-06-06 ENCOUNTER — Ambulatory Visit (INDEPENDENT_AMBULATORY_CARE_PROVIDER_SITE_OTHER): Payer: BLUE CROSS/BLUE SHIELD | Admitting: Family Medicine

## 2017-06-06 ENCOUNTER — Encounter: Payer: Self-pay | Admitting: Family Medicine

## 2017-06-06 VITALS — BP 120/86 | HR 87 | Ht 75.0 in | Wt 235.0 lb

## 2017-06-06 DIAGNOSIS — R7401 Elevation of levels of liver transaminase levels: Secondary | ICD-10-CM

## 2017-06-06 DIAGNOSIS — E782 Mixed hyperlipidemia: Secondary | ICD-10-CM

## 2017-06-06 DIAGNOSIS — R740 Nonspecific elevation of levels of transaminase and lactic acid dehydrogenase [LDH]: Secondary | ICD-10-CM | POA: Diagnosis not present

## 2017-06-06 DIAGNOSIS — Z6829 Body mass index (BMI) 29.0-29.9, adult: Secondary | ICD-10-CM | POA: Diagnosis not present

## 2017-06-06 DIAGNOSIS — Z23 Encounter for immunization: Secondary | ICD-10-CM

## 2017-06-06 DIAGNOSIS — M1A00X0 Idiopathic chronic gout, unspecified site, without tophus (tophi): Secondary | ICD-10-CM | POA: Diagnosis not present

## 2017-06-06 LAB — COMPLETE METABOLIC PANEL WITH GFR
AG Ratio: 1.5 (calc) (ref 1.0–2.5)
ALBUMIN MSPROF: 4.3 g/dL (ref 3.6–5.1)
ALKALINE PHOSPHATASE (APISO): 73 U/L (ref 40–115)
ALT: 54 U/L — ABNORMAL HIGH (ref 9–46)
AST: 55 U/L — AB (ref 10–40)
BUN: 17 mg/dL (ref 7–25)
CALCIUM: 9.2 mg/dL (ref 8.6–10.3)
CHLORIDE: 104 mmol/L (ref 98–110)
CO2: 26 mmol/L (ref 20–32)
Creat: 1.04 mg/dL (ref 0.60–1.35)
GFR, EST AFRICAN AMERICAN: 104 mL/min/{1.73_m2} (ref >=60)
GFR, EST NON AFRICAN AMERICAN: 89 mL/min/{1.73_m2} (ref >=60)
GLUCOSE: 111 mg/dL — AB (ref 65–99)
Globulin: 2.8 g/dL (calc) (ref 1.9–3.7)
POTASSIUM: 4.5 mmol/L (ref 3.5–5.3)
SODIUM: 137 mmol/L (ref 135–146)
Total Bilirubin: 0.8 mg/dL (ref 0.2–1.2)
Total Protein: 7.1 g/dL (ref 6.1–8.1)

## 2017-06-06 LAB — CBC
HCT: 45.5 % (ref 38.5–50.0)
HEMOGLOBIN: 15.8 g/dL (ref 13.2–17.1)
MCH: 29.3 pg (ref 27.0–33.0)
MCHC: 34.7 g/dL (ref 32.0–36.0)
MCV: 84.3 fL (ref 80.0–100.0)
MPV: 10.8 fL (ref 7.5–12.5)
Platelets: 211 10*3/uL (ref 140–400)
RBC: 5.4 10*6/uL (ref 4.20–5.80)
RDW: 13.7 % (ref 11.0–15.0)
WBC: 7.8 10*3/uL (ref 3.8–10.8)

## 2017-06-06 LAB — LIPID PANEL W/REFLEX DIRECT LDL
Cholesterol: 212 mg/dL — ABNORMAL HIGH
HDL: 25 mg/dL — ABNORMAL LOW
Non-HDL Cholesterol (Calc): 187 mg/dL — ABNORMAL HIGH
Total CHOL/HDL Ratio: 8.5 (calc) — ABNORMAL HIGH
Triglycerides: 415 mg/dL — ABNORMAL HIGH

## 2017-06-06 LAB — DIRECT LDL: Direct LDL: 88 mg/dL

## 2017-06-06 LAB — URIC ACID: Uric Acid, Serum: 5.6 mg/dL (ref 4.0–8.0)

## 2017-06-06 NOTE — Progress Notes (Signed)
Charles Novak is a 41 y.o. male who presents to [facility]: [place] today for follow-up of gout and hyperlipidemia.  Gout: Patient is tolerating recent change in allopurinol dose well. He has not had any acute flares in the last 6 months.   Hyperlipidemia: Patient has a history of elevated cholesterol. Over the last month he has started a low carbohydrate diet consisting mostly of lean meats, vegetables, and salads. Patient has lost about 15 lbs. He is confident this is sustainable. Patient has also started taking 2400mg  of fish oil daily. Patient has also worked to increase his exercise and is trying to walk at least 30 minutes each day.  Patient has not had any abdominal pain, bloody stool, black stool, changes in frequency of stool, changes in urination, shortness of breath, or chest pain.    Past Medical History:  Diagnosis Date  . Ulcerative colitis ([place]) 1991   Past Surgical History:  Procedure Laterality Date  . WISDOM TOOTH EXTRACTION  07/1997   Social History  Substance Use Topics  . Smoking status: Former Smoker    Quit date: 04/25/2008  . Smokeless tobacco: Never Used  . Alcohol use 1.8 - 3.0 oz/week    3 - 5 Standard drinks or equivalent per week   family history includes Depression in his unknown relative; Diabetes in his unknown relative; Heart attack in his unknown relative; Hyperlipidemia in his father; Hypertension in his father; Leukemia in his unknown relative.  ROS as above:  Medications: Current Outpatient Prescriptions  Medication Sig Dispense Refill  . allopurinol (ZYLOPRIM) 300 MG tablet TAKE 1 TABLET (300 MG TOTAL) BY MOUTH DAILY. 90 tablet 1  . balsalazide (COLAZAL) 750 MG capsule Take 3 capsules (2,250 mg total) by mouth 3 (three) times daily.    . colchicine 0.6 MG tablet Take 1 tablet (0.6 mg total) by mouth daily. 90 tablet 0  . diclofenac  sodium (VOLTAREN) 1 % GEL Apply 2 g topically 4 (four) times daily. To affected joint. 100 g 11  . meloxicam (MOBIC) 15 MG tablet TAKE 1 TABLET BY MOUTH EVERY MORNING WITH BREAKFAST FOR 2 WEEKS THEN DAILY AS NEEDED FOR PAIN *10/9 90 tablet 1  . pantoprazole (PROTONIX) 40 MG tablet Take 1 tablet (40 mg total) by mouth daily. 30 tablet 3  . sildenafil (REVATIO) 20 MG tablet 2-4 by mouth only as needed for sex 50 tablet 0   No current facility-administered medications for this visit.    No Known Allergies  Health Maintenance Health Maintenance  Topic Date Due  . TETANUS/TDAP  06/19/1995  . INFLUENZA VACCINE  06/06/2018 (Originally 03/30/2017)  . HIV Screening  Completed     Exam:  BP 120/86   Pulse 87   Ht 6\' 3"  (1.905 m)   Wt 235 lb (106.6 kg)   BMI 29.37 kg/m  Gen: Well NAD, sitting comfortably in chair HEENT: EOMI,  MMM Lungs: Normal work of breathing. CTABL Heart: RRR, normal S1 and S2, no MRG Abd: NABS, Soft. Nondistended, Nontender Exts: Brisk capillary refill, warm and well perfused.    No results found for this or any previous visit (from the past 72 hour(s)). No results found.    Assessment and Plan: 41 y.o. male presenting for follow-up of gout and hyperlipidemia. He is not having any gout flares and there is no need to change medication dosing at this time. Patient will have uric acid levels checked today.  Hyperlipidemia: Patient  has made significant dietary changes, increased his exercise, and started taking fish oil. He is doing really well and should continue these lifestyle modifications. We will recheck labs today for improvement in cholesterol.   Obesity: Charles Novak is no longer obese due to weight loss. Plan to continue diet and exercise and recheck as needed   Tdap given  Orders Placed This Encounter  Procedures  . CBC  . COMPLETE METABOLIC PANEL WITH GFR  . Lipid Panel w/reflex Direct LDL  . Uric acid   No orders of the defined types were placed in this  encounter.    Discussed warning signs or symptoms. Please see discharge instructions. Patient expresses understanding.

## 2017-06-06 NOTE — Patient Instructions (Signed)
Thank you for coming in today. We will check labs today.  I will get results to you ASAP.  Continue healthy lifestyle.   We may consider Vascepa.   Icosapent ethyl capsules What is this medicine? ICOSAPENT ETHYL (eye KOE sa pent eth il) contains essential fats. It is used to treat high triglyceride levels. This medicine may be used for other purposes; ask your health care provider or pharmacist if you have questions. COMMON BRAND NAME(S): VASCEPA What should I tell my health care provider before I take this medicine? They need to know if you have any of these conditions: -bleeding disorders -liver disease -an unusual or allergic reaction to icosapent ethyl, fish, shellfish, other medicines, foods, dyes, or preservatives -pregnant or trying to get pregnant -breast-feeding How should I use this medicine? Take this medicine by mouth with a glass of water. Follow the directions on the prescription label. Take this medicine with food. Do not cut, crush or chew this medicine. Take your medicine at regular intervals. Do not take it more often than directed. Do not stop taking except on your doctor's advice. Talk to your pediatrician regarding the use of this medicine in children. Special care may be needed. Overdosage: If you think you have taken too much of this medicine contact a poison control center or emergency room at once. NOTE: This medicine is only for you. Do not share this medicine with others. What if I miss a dose? If you miss a dose, take it as soon as you can. If it is almost time for your next dose, take only that dose. Do not take double or extra doses. What may interact with this medicine? This medicine may interact with the following medications: -aspirin and aspirin-like medicines -beta-blockers like metoprolol and propranolol -certain medicines that treat or prevent blood clots like warfarin, enoxaparin, dalteparin, apixaban, dabigatran, and  rivaroxaban -diuretics -male hormones, like estrogens and birth control pills This list may not describe all possible interactions. Give your health care provider a list of all the medicines, herbs, non-prescription drugs, or dietary supplements you use. Also tell them if you smoke, drink alcohol, or use illegal drugs. Some items may interact with your medicine. What should I watch for while using this medicine? You may need blood work done while you are taking this medicine. Follow a good diet and exercise plan. Taking this medicine does not replace a healthy lifestyle. Some foods that have omega-3 fatty acids naturally are fatty fish like albacore tuna, halibut, herring, mackerel, lake trout, salmon, and sardines. If you are scheduled for any medical or dental procedure, tell your healthcare provider that you are taking this medicine. You may need to stop taking this medicine before the procedure. What side effects may I notice from receiving this medicine? Side effects that you should report to your doctor or health care professional as soon as possible: -allergic reactions like skin rash, itching or hives, swelling of the face, lips, or tongue -breathing problems -unusual bleeding or bruising Side effects that usually do not require medical attention (report to your doctor or health care professional if they continue or are bothersome): -joint pain -sore throat This list may not describe all possible side effects. Call your doctor for medical advice about side effects. You may report side effects to FDA at 1-800-FDA-1088. Where should I keep my medicine? Keep out of the reach of children. Store at room temperature between 15 and 30 degrees C (59 and 86 degrees F). Throw away any unused  medicine after the expiration date. NOTE: This sheet is a summary. It may not cover all possible information. If you have questions about this medicine, talk to your doctor, pharmacist, or health care  provider.  2018 Elsevier/Gold Standard (2015-09-18 13:40:36)

## 2017-06-10 ENCOUNTER — Encounter: Payer: Self-pay | Admitting: Family Medicine

## 2017-06-13 MED ORDER — ICOSAPENT ETHYL 1 G PO CAPS: 1.0000 | ORAL_CAPSULE | Freq: Two times a day (BID) | ORAL | 3 refills | Status: DC

## 2017-06-16 ENCOUNTER — Telehealth: Payer: Self-pay | Admitting: *Deleted

## 2017-06-16 NOTE — Telephone Encounter (Signed)
Approvedtoday  Your PA request has been approved. Additional information will be provided in the approval communication. (Message [id number]   patient and pharmacy notified.

## 2017-07-19 DIAGNOSIS — K5150 Left sided colitis without complications: Secondary | ICD-10-CM | POA: Diagnosis not present

## 2017-07-19 DIAGNOSIS — K6389 Other specified diseases of intestine: Secondary | ICD-10-CM | POA: Diagnosis not present

## 2017-07-19 LAB — HM COLONOSCOPY

## 2017-08-03 ENCOUNTER — Other Ambulatory Visit: Payer: Self-pay | Admitting: Family Medicine

## 2017-08-15 ENCOUNTER — Encounter: Payer: Self-pay | Admitting: *Deleted

## 2017-09-19 ENCOUNTER — Emergency Department (INDEPENDENT_AMBULATORY_CARE_PROVIDER_SITE_OTHER): Payer: BLUE CROSS/BLUE SHIELD

## 2017-09-19 ENCOUNTER — Other Ambulatory Visit: Payer: Self-pay

## 2017-09-19 ENCOUNTER — Emergency Department
Admission: EM | Admit: 2017-09-19 | Discharge: 2017-09-19 | Disposition: A | Discharge status: Home / Self Care | Payer: BLUE CROSS/BLUE SHIELD | Source: Home / Self Care | Attending: Family Medicine | Admitting: Family Medicine

## 2017-09-19 DIAGNOSIS — T7840XA Allergy, unspecified, initial encounter: Secondary | ICD-10-CM | POA: Diagnosis not present

## 2017-09-19 DIAGNOSIS — J40 Bronchitis, not specified as acute or chronic: Secondary | ICD-10-CM

## 2017-09-19 DIAGNOSIS — R05 Cough: Secondary | ICD-10-CM | POA: Diagnosis not present

## 2017-09-19 MED ORDER — AZITHROMYCIN 250 MG PO TABS: ORAL_TABLET | ORAL | 0 refills | Status: DC

## 2017-09-19 MED ORDER — METHYLPREDNISOLONE SODIUM SUCC 125 MG IJ SOLR
80.0000 mg | Freq: Once | INTRAMUSCULAR | Status: AC
  Administered 2017-09-19: 80 mg via INTRAMUSCULAR

## 2017-09-19 MED ORDER — PREDNISONE 20 MG PO TABS: ORAL_TABLET | ORAL | 0 refills | Status: DC

## 2017-09-19 NOTE — Discharge Instructions (Signed)
Begin prednisone Tuesday 09/20/17. May continue applying 1% hydrocortisone cream 2 or 3 times daily for itching. Take plain guaifenesin (1200mg  extended release tabs such as Mucinex) twice daily, with plenty of water, for cough and congestion. Get adequate rest.   Stop Claritin D for now. If symptoms become significantly worse during the night or over the weekend, proceed to the local emergency room.

## 2017-09-19 NOTE — ED Provider Notes (Signed)
[facility]    CSN: [id number] Arrival date & time: 09/19/17  0940     History   Chief Complaint Chief Complaint  Patient presents with  . Rash    HPI Joeseph Verville is a 42 y.o. male.   Patient reports that he has had a persistent cough for about a month.  Six days ago while in [place], he visited an urgent care clinic where he was prescribed amoxicillin 750mg  BID, a prednisone taper, and Tessalon perles.  Three days ago he developed a pruritic rash on both hands and he stopped taking amoxicillin but continued the prednisone (took last dose today).  His hands have been swollen and pruritic, somewhat improved with 1% HC cream.  He has developed some mild similar rash on lower legs and groin area.  He reports that his cough has not improved much, although he does not feel ill.  No fevers, chills, and sweats.  No shortness of breath or pleuritic pain. He has a past history of pneumonia as a child.   The history is provided by the patient.    Past Medical History:  Diagnosis Date  . Ulcerative colitis ([place]) 1991    Patient Active Problem List   Diagnosis Date Noted  . BMI 29.0-29.9,adult 06/06/2017  . Transaminitis 07/30/2016  . Chronic gout 07/30/2016  . Hyperlipidemia 09/10/2015  . Ulcerative colitis ([place]) 02/27/2015    Past Surgical History:  Procedure Laterality Date  . WISDOM TOOTH EXTRACTION  07/1997       Home Medications    Prior to Admission medications   Medication Sig Start Date End Date Taking? Authorizing Provider  allopurinol (ZYLOPRIM) 300 MG tablet TAKE 1 TABLET BY MOUTH EVERY DAY 08/03/17   Gregor Hams, MD  azithromycin (ZITHROMAX Z-PAK) 250 MG tablet Take 2 tabs today; then begin one tab once daily for 4 more days. 09/19/17   Kandra Nicolas, MD  balsalazide (COLAZAL) 750 MG capsule Take 3 capsules (2,250 mg total) by mouth 3 (three) times daily. 02/27/15   Marcial Pacas, DO  colchicine 0.6 MG tablet Take 1 tablet (0.6 mg total) by mouth  daily. 09/06/16   Gregor Hams, MD  diclofenac sodium (VOLTAREN) 1 % GEL Apply 2 g topically 4 (four) times daily. To affected joint. 07/29/16   Gregor Hams, MD  Icosapent Ethyl 1 g CAPS Take 1 capsule by mouth 2 (two) times daily. 06/13/17   Gregor Hams, MD  meloxicam (MOBIC) 15 MG tablet TAKE 1 TABLET BY MOUTH EVERY MORNING WITH BREAKFAST FOR 2 WEEKS THEN DAILY AS NEEDED FOR PAIN *10/9 02/09/17   Silverio Decamp, MD  pantoprazole (PROTONIX) 40 MG tablet Take 1 tablet (40 mg total) by mouth daily. 09/08/15   Hommel, Hilliard Clark, DO  predniSONE (DELTASONE) 20 MG tablet Take one tab by mouth twice daily for 5 days, then one daily for 3 days. Take with food. 09/19/17   Kandra Nicolas, MD  sildenafil (REVATIO) 20 MG tablet 2-4 by mouth only as needed for sex 02/24/15   Marcial Pacas, DO    Family History Family History  Problem Relation Age of Onset  . Leukemia Unknown        grandmother   . Heart attack Unknown        grandfather  . Depression Unknown        brother   . Diabetes Unknown        parents  . Hyperlipidemia Father   . Hypertension Father  Social History Social History   Tobacco Use  . Smoking status: Former Smoker    Last attempt to quit: 04/25/2008    Years since quitting: 9.4  . Smokeless tobacco: Never Used  Substance Use Topics  . Alcohol use: Yes    Alcohol/week: 1.8 - 3.0 oz    Types: 3 - 5 Standard drinks or equivalent per week  . Drug use: No     Allergies   Patient has no known allergies.   Review of Systems Review of Systems No sore throat + cough No pleuritic pain No wheezing No nasal congestion No post-nasal drainage No sinus pain/pressure No itchy/red eyes No earache No hemoptysis No SOB No fever/chills No nausea No vomiting No abdominal pain No diarrhea No urinary symptoms + skin rash No fatigue No myalgias No headache Used OTC meds without relief   Physical Exam Triage Vital Signs ED Triage Vitals  Enc Vitals Group      BP 09/19/17 1039 128/86     Pulse Rate 09/19/17 1039 87     Resp --      Temp 09/19/17 1039 97.6 F (36.4 C)     Temp Source 09/19/17 1039 Oral     SpO2 09/19/17 1039 97 %     Weight 09/19/17 1047 229 lb (103.9 kg)     Height 09/19/17 1047 6\' 3"  (1.905 m)     Head Circumference --      Peak Flow --      Pain Score --      Pain Loc --      Pain Edu? --      Excl. in [place]? --    No data found.  Updated Vital Signs BP 128/86 (BP Location: Right Arm)   Pulse 87   Temp 97.6 F (36.4 C) (Oral)   Ht 6\' 3"  (1.905 m)   Wt 229 lb (103.9 kg)   SpO2 97%   BMI 28.62 kg/m   Visual Acuity Right Eye Distance:   Left Eye Distance:   Bilateral Distance:    Right Eye Near:   Left Eye Near:    Bilateral Near:     Physical Exam  Constitutional: He appears well-developed and well-nourished. No distress.  HENT:  Head: Normocephalic.  Right Ear: Tympanic membrane, external ear and ear canal normal.  Left Ear: Tympanic membrane, external ear and ear canal normal.  Nose: Nose normal.  Mouth/Throat: Oropharynx is clear and moist.  Eyes: Conjunctivae and EOM are normal. Pupils are equal, round, and reactive to light.  Neck: Neck supple.  Cardiovascular: Normal heart sounds.  Pulmonary/Chest: No stridor. He has no wheezes. He has no rales.  Coarse breath sounds.  Abdominal: There is no tenderness.  Musculoskeletal: He exhibits no edema.       Right hand: He exhibits tenderness and swelling. He exhibits normal range of motion, no bony tenderness, normal two-point discrimination, normal capillary refill, no deformity and no laceration. Normal sensation noted.       Hands: Both hands have confluent macular erythema with mild swelling and tenderness in distribution as noted on diagram.   Lymphadenopathy:    He has no cervical adenopathy.  Skin: Skin is warm and dry.  Nursing note and vitals reviewed.    UC Treatments / Results  Labs (all labs ordered are listed, but only abnormal  results are displayed) Labs Reviewed  POCT CBC W AUTO DIFF ([place]):  WBC 16.2; LY 22.0; MO 5.7; GR 72.3; Hgb 17.1; Platelets  251     EKG  EKG Interpretation None       Radiology Dg Chest 2 View  Result Date: 09/19/2017 CLINICAL DATA:  One month of cough, nonsmoker. EXAM: CHEST  2 VIEW COMPARISON:  None in PACs FINDINGS: The lungs are adequately inflated. The interstitial markings are mildly increased. The heart and pulmonary vascularity are normal. The mediastinum is normal in width. The trachea is midline. The bony thorax exhibits no acute abnormality. IMPRESSION: Mild bilateral interstitial prominence may be normal for the patient but could reflect acute bronchitis. No pneumonia, CHF, nor other acute cardiopulmonary abnormality. Electronically Signed   By: David  Martinique M.D.   On: 09/19/2017 11:50    Procedures Procedures (including critical care time)  Medications Ordered in UC Medications  methylPREDNISolone sodium succinate (SOLU-MEDROL) 125 mg/2 mL injection 80 mg (80 mg Intramuscular Given 09/19/17 1244)     Initial Impression / Assessment and Plan / UC Course  I have reviewed the triage vital signs and the nursing notes.  Pertinent labs & imaging results that were available during my care of the patient were reviewed by me and considered in my medical decision making (see chart for details).    Note significant leukocytosis (WBC 16.2), and interstitial prominence on chest X-ray.  Concern for bacterial bronchitis. Begin Z-pak for atypical coverage. Administered Solumedrol 80mg  IM. Begin prednisone burst/taper Tuesday 09/20/17. May continue applying 1% hydrocortisone cream 2 or 3 times daily for itching. Take plain guaifenesin (1200mg  extended release tabs such as Mucinex) twice daily, with plenty of water, for cough and congestion. Get adequate rest.   Stop Claritin D for now. Return to Dr. Aundria Mems in 3 days (Dr. Georgina Snell out of town) for repeat CBC  and followup. If symptoms become significantly worse during the night or over the weekend, proceed to the local emergency room.     Final Clinical Impressions(s) / UC Diagnoses   Final diagnoses:  Allergic reaction, initial encounter  Bronchitis    ED Discharge Orders        Ordered    azithromycin (ZITHROMAX Z-PAK) 250 MG tablet     09/19/17 1238    predniSONE (DELTASONE) 20 MG tablet     09/19/17 1238           Kandra Nicolas, MD 09/19/17 1948

## 2017-09-19 NOTE — ED Triage Notes (Signed)
Pt was seen in an UC in [place] last Tuesday for a cough, started on Amoxicillin, Friday he developed a rah on his hands.  Yesterday his hands started swelling,  Now has rash on feet and groin area as well.

## 2017-09-20 LAB — POCT CBC W AUTO DIFF (K'VILLE URGENT CARE)

## 2017-09-22 ENCOUNTER — Ambulatory Visit: Payer: BLUE CROSS/BLUE SHIELD | Admitting: Sports Medicine

## 2017-09-22 ENCOUNTER — Encounter: Payer: Self-pay | Admitting: Sports Medicine

## 2017-09-22 DIAGNOSIS — J209 Acute bronchitis, unspecified: Secondary | ICD-10-CM | POA: Diagnosis not present

## 2017-09-22 MED ORDER — ALBUTEROL SULFATE HFA 108 (90 BASE) MCG/ACT IN AERS: 2.0000 | INHALATION_SPRAY | Freq: Four times a day (QID) | RESPIRATORY_TRACT | 11 refills | Status: DC | PRN

## 2017-09-22 MED ORDER — DEXTROMETHORPHAN POLISTIREX ER 30 MG/5ML PO SUER: 60.0000 mg | Freq: Two times a day (BID) | ORAL | 0 refills | Status: DC

## 2017-09-22 NOTE — Progress Notes (Signed)
Subjective:    CC: Coughing  HPI: This is a pleasant 42 year old male, his symptoms started with a cough around Christmas, he was seen at an urgent care in [place], prescribed amoxicillin, unfortunately he developed a severe rash, hands, belly.  He was then seen in urgent care and was given parenteral steroids, oral prednisone, and switched to azithromycin.  Symptoms are now starting to improve.  He still has a cough, really nonproductive.  Rash is heading in the right direction, no oral or mucocutaneous involvement.  I reviewed the past medical history, family history, social history, surgical history, and allergies today and no changes were needed.  Please see the problem list section below in epic for further details.  Past Medical History: Past Medical History:  Diagnosis Date  . Ulcerative colitis ([place]) 1991   Past Surgical History: Past Surgical History:  Procedure Laterality Date  . WISDOM TOOTH EXTRACTION  07/1997   Social History: Social History   Socioeconomic History  . Marital status: Single    Spouse name: None  . Number of children: None  . Years of education: None  . Highest education level: None  Social Needs  . Financial resource strain: None  . Food insecurity - worry: None  . Food insecurity - inability: None  . Transportation needs - medical: None  . Transportation needs - non-medical: None  Occupational History  . None  Tobacco Use  . Smoking status: Former Smoker    Last attempt to quit: 04/25/2008    Years since quitting: 9.4  . Smokeless tobacco: Never Used  Substance and Sexual Activity  . Alcohol use: Yes    Alcohol/week: 1.8 - 3.0 oz    Types: 3 - 5 Standard drinks or equivalent per week  . Drug use: No  . Sexual activity: Not Currently    Partners: Female  Other Topics Concern  . None  Social History Narrative  . None   Family History: Family History  Problem Relation Age of Onset  . Leukemia Unknown        grandmother   . Heart  attack Unknown        grandfather  . Depression Unknown        brother   . Diabetes Unknown        parents  . Hyperlipidemia Father   . Hypertension Father    Allergies: Allergies  Allergen Reactions  . Amoxicillin Itching, Rash and Swelling   Medications: See med rec.  Review of Systems: No fevers, chills, night sweats, weight loss, chest pain, or shortness of breath.   Objective:    General: Well Developed, well nourished, and in no acute distress.  Neuro: Alert and oriented x3, extra-ocular muscles intact, sensation grossly intact.  HEENT: Normocephalic, atraumatic, pupils equal round reactive to light, neck supple, no masses, no lymphadenopathy, thyroid nonpalpable.  Skin: Warm and dry, maculopapular rash over the torso, hands.  Palms. Cardiac: Regular rate and rhythm, no murmurs rubs or gallops, no lower extremity edema.  Respiratory: Trace diffuse expiratory wheezes. Not using accessory muscles, speaking in full sentences.  Impression and Recommendations:    Acute bronchitis This is been fairly prolonged since before Christmas, almost 1 month. Initially treated with amoxicillin, ended up with an allergic reaction, switched to azithromycin, and given prednisone. Rashes improving, still has a bit of a cough, for the most part nonproductive. I do think this is more of a postinfectious cough syndrome, likely with some postinfectious bronchial reactivity. He did have some trace  wheezing on exam, adding albuterol inhaler for use up to every 4 hours and when going out in the cold. He will stop his narcotic cough suppressants, stop his Ladona Ridgel which is not working and simply use Delsym over-the-counter. Return to see [place] if no improvement in a month at which point we will proceed with pre-and postbronchodilator spirometry.  ___________________________________________ Gwen Her. Dianah Field, M.D., ABFM., CAQSM. Primary Care and [place] Instructor of [place] [employer] [facility] [employer]

## 2017-09-22 NOTE — Assessment & Plan Note (Signed)
This is been fairly prolonged since before Christmas, almost 1 month. Initially treated with amoxicillin, ended up with an allergic reaction, switched to azithromycin, and given prednisone. Rashes improving, still has a bit of a cough, for the most part nonproductive. I do think this is more of a postinfectious cough syndrome, likely with some postinfectious bronchial reactivity. He did have some trace wheezing on exam, adding albuterol inhaler for use up to every 4 hours and when going out in the cold. He will stop his narcotic cough suppressants, stop his Ladona Ridgel which is not working and simply use Delsym over-the-counter. Return to see [place] if no improvement in a month at which point we will proceed with pre-and postbronchodilator spirometry.

## 2017-11-14 ENCOUNTER — Other Ambulatory Visit: Payer: Self-pay | Admitting: Family Medicine

## 2018-01-20 ENCOUNTER — Other Ambulatory Visit: Payer: Self-pay | Admitting: Family Medicine

## 2018-05-04 ENCOUNTER — Other Ambulatory Visit: Payer: Self-pay

## 2018-05-04 MED ORDER — ICOSAPENT ETHYL 1 G PO CAPS: 1.0000 | ORAL_CAPSULE | Freq: Two times a day (BID) | ORAL | 0 refills | Status: DC

## 2018-07-19 ENCOUNTER — Encounter: Payer: Self-pay | Admitting: Family Medicine

## 2018-07-19 ENCOUNTER — Ambulatory Visit: Payer: BLUE CROSS/BLUE SHIELD | Admitting: Family Medicine

## 2018-07-19 VITALS — BP 129/90 | HR 97 | Ht 75.0 in | Wt 240.0 lb

## 2018-07-19 DIAGNOSIS — Z0000 Encounter for general adult medical examination without abnormal findings: Secondary | ICD-10-CM | POA: Diagnosis not present

## 2018-07-19 DIAGNOSIS — Z6830 Body mass index (BMI) 30.0-30.9, adult: Secondary | ICD-10-CM

## 2018-07-19 DIAGNOSIS — R7401 Elevation of levels of liver transaminase levels: Secondary | ICD-10-CM

## 2018-07-19 DIAGNOSIS — R740 Nonspecific elevation of levels of transaminase and lactic acid dehydrogenase [LDH]: Secondary | ICD-10-CM

## 2018-07-19 DIAGNOSIS — K5100 Ulcerative (chronic) pancolitis without complications: Secondary | ICD-10-CM | POA: Diagnosis not present

## 2018-07-19 DIAGNOSIS — E782 Mixed hyperlipidemia: Secondary | ICD-10-CM | POA: Diagnosis not present

## 2018-07-19 MED ORDER — ICOSAPENT ETHYL 1 G PO CAPS: 1.0000 | ORAL_CAPSULE | Freq: Two times a day (BID) | ORAL | 6 refills | Status: DC

## 2018-07-19 MED ORDER — ALLOPURINOL 300 MG PO TABS: 300.0000 mg | ORAL_TABLET | Freq: Every day | ORAL | 3 refills | Status: DC

## 2018-07-19 NOTE — Patient Instructions (Addendum)
Thank you for coming in today. I think you should have CMP, Lipid panel and Uric acid.  If you gastroenterologist wants to add them on that would be great. Otherwise I can roder them for a few weeks.   We can consider pulmonary function test.    Recheck yearly if all is well.    Asthma, Adult Asthma is a condition of the lungs in which the airways tighten and narrow. Asthma can make it hard to breathe. Asthma cannot be cured, but medicine and lifestyle changes can help control it. Asthma may be started (triggered) by:  Animal skin flakes (dander).  Dust.  Cockroaches.  Pollen.  Mold.  Smoke.  Cleaning products.  Hair sprays or aerosol sprays.  Paint fumes or strong smells.  Cold air, weather changes, and winds.  Crying or laughing hard.  Stress.  Certain medicines or drugs.  Foods, such as dried fruit, potato chips, and sparkling grape juice.  Infections or conditions (colds, flu).  Exercise.  Certain medical conditions or diseases.  Exercise or tiring activities.  Follow these instructions at home:  Take medicine as told by your doctor.  Use a peak flow meter as told by your doctor. A peak flow meter is a tool that measures how well the lungs are working.  Record and keep track of the peak flow meter's readings.  Understand and use the asthma action plan. An asthma action plan is a written plan for taking care of your asthma and treating your attacks.  To help prevent asthma attacks: ? Do not smoke. Stay away from secondhand smoke. ? Change your heating and air conditioning filter often. ? Limit your use of fireplaces and wood stoves. ? Get rid of pests (such as roaches and mice) and their droppings. ? Throw away plants if you see mold on them. ? Clean your floors. Dust regularly. Use cleaning products that do not smell. ? Have someone vacuum when you are not home. Use a vacuum cleaner with a HEPA filter if possible. ? Replace carpet with wood,  tile, or vinyl flooring. Carpet can trap animal skin flakes and dust. ? Use allergy-proof pillows, mattress covers, and box spring covers. ? Wash bed sheets and blankets every week in hot water and dry them in a dryer. ? Use blankets that are made of polyester or cotton. ? Clean bathrooms and kitchens with bleach. If possible, have someone repaint the walls in these rooms with mold-resistant paint. Keep out of the rooms that are being cleaned and painted. ? Wash hands often. Contact a doctor if:  You have make a whistling sound when breaking (wheeze), have shortness of breath, or have a cough even if taking medicine to prevent attacks.  The colored mucus you cough up (sputum) is thicker than usual.  The colored mucus you cough up changes from clear or white to yellow, green, gray, or bloody.  You have problems from the medicine you are taking such as: ? A rash. ? Itching. ? Swelling. ? Trouble breathing.  You need reliever medicines more than 2-3 times a week.  Your peak flow measurement is still at 50-79% of your personal best after following the action plan for 1 hour.  You have a fever. Get help right away if:  You seem to be worse and are not responding to medicine during an asthma attack.  You are short of breath even at rest.  You get short of breath when doing very little activity.  You have trouble eating,  drinking, or talking.  You have chest pain.  You have a fast heartbeat.  Your lips or fingernails start to turn blue.  You are light-headed, dizzy, or faint.  Your peak flow is less than 50% of your personal best. This information is not intended to replace advice given to you by your health care provider. Make sure you discuss any questions you have with your health care provider. Document Released: 02/02/2008 Document Revised: 01/22/2016 Document Reviewed: 03/15/2013 Elsevier Interactive Patient Education  2017 [employer].

## 2018-07-19 NOTE — Progress Notes (Signed)
Charles Novak is a 42 y.o. male who presents to [facility] today for well adult visit.  Supreme is doing well.  He takes medications listed below.  He ran out of Vascepa a few months ago and has been taking over-the-counter fish oil pills.  He exercises regularly and feels pretty happy with how things are going.  He notes some occasional cough and wheezing but feels well as well.  He has a history of ulcerative colitis and gets regular care of you his gastroenterologist.  He notes he is due for visit with gastrology where he expects labs will be done.   ROS as above:  Past Medical History:  Diagnosis Date  . Ulcerative colitis ([employer]) 1991   Past Surgical History:  Procedure Laterality Date  . WISDOM TOOTH EXTRACTION  07/1997   Social History   Tobacco Use  . Smoking status: Former Smoker    Last attempt to quit: 04/25/2008    Years since quitting: 10.2  . Smokeless tobacco: Never Used  Substance Use Topics  . Alcohol use: Yes    Alcohol/week: 3.0 - 5.0 standard drinks    Types: 3 - 5 Standard drinks or equivalent per week   family history includes Depression in his unknown relative; Diabetes in his unknown relative; Heart attack in his unknown relative; Hyperlipidemia in his father; Hypertension in his father; Leukemia in his unknown relative.  Medications: Current Outpatient Medications  Medication Sig Dispense Refill  . albuterol (PROVENTIL HFA;VENTOLIN HFA) 108 (90 Base) MCG/ACT inhaler Inhale 2 puffs into the lungs every 6 (six) hours as needed for wheezing. 2 Inhaler 11  . allopurinol (ZYLOPRIM) 300 MG tablet Take 1 tablet (300 mg total) by mouth daily. 90 tablet 3  . balsalazide (COLAZAL) 750 MG capsule Take 3 capsules (2,250 mg total) by mouth 3 (three) times daily.    . diphenoxylate-atropine (LOMOTIL) 2.5-0.025 MG/5ML liquid Take by mouth 4 (four) times  daily as needed.    Vanessa Kick Ethyl (VASCEPA) 1 g CAPS Take 1 capsule (1 g total) by mouth 2 (two) times daily. 30 capsule 6  . pantoprazole (PROTONIX) 40 MG tablet Take 1 tablet (40 mg total) by mouth daily. 30 tablet 3   No current facility-administered medications for this visit.    Allergies  Allergen Reactions  . Amoxicillin Itching, Rash and Swelling    Health Maintenance Health Maintenance  Topic Date Due  . INFLUENZA VACCINE  07/20/2019 (Originally 03/30/2018)  . TETANUS/TDAP  06/07/2027  . HIV Screening  Completed     Exam:  BP 129/90   Pulse 97   Ht 6\' 3"  (1.905 m)   Wt 240 lb (108.9 kg)   BMI 30.00 kg/m  Wt Readings from Last 5 Encounters:  07/19/18 240 lb (108.9 kg)  09/22/17 229 lb (103.9 kg)  09/19/17 229 lb (103.9 kg)  06/06/17 235 lb (106.6 kg)  12/06/16 246 lb (111.6 kg)      Gen: Well NAD HEENT: EOMI,  MMM Lungs: Normal work of breathing. CTABL Heart: RRR no MRG Abd: NABS, Soft. Nondistended, Nontender Exts: Brisk capillary refill, warm and well perfused.  Psych: Alert and oriented normal speech thought process and affect.  Depression screen [facility] 2/9 07/19/2018 06/06/2017  Decreased Interest 0 0  Down, Depressed, Hopeless 0 0  PHQ - 2 Score 0 0  Altered sleeping 0 -  Tired, decreased energy 0 -  Change in appetite 0 -  Feeling  bad or failure about yourself  0 -  Trouble concentrating 0 -  Moving slowly or fidgety/restless 0 -  Suicidal thoughts 0 -  PHQ-9 Score 0 -  Difficult doing work/chores Not difficult at all -       Lab and Radiology Results No results found for this or any previous visit (from the past 72 hour(s)). No results found.    Assessment and Plan: 42 y.o. male with well adult.  Doing reasonably well.  Plan to obtain basic fasting labs.  Anticipate labs at gastroenterology.  Hopefully will do CBC, CMP, lipid panel, uric acid.  If these labs are not completed we will add on additional labs as needed.  Will check in  about 3 weeks to see lab results.  Medications refilled.  Recheck with me yearly or sooner if needed.  Patient declined flu vaccine.  Recommend improved diet and exercise for weight management. No orders of the defined types were placed in this encounter.  Meds ordered this encounter  Medications  . Icosapent Ethyl (VASCEPA) 1 g CAPS    Sig: Take 1 capsule (1 g total) by mouth 2 (two) times daily.    Dispense:  30 capsule    Refill:  6  . allopurinol (ZYLOPRIM) 300 MG tablet    Sig: Take 1 tablet (300 mg total) by mouth daily.    Dispense:  90 tablet    Refill:  3     Discussed warning signs or symptoms. Please see discharge instructions. Patient expresses understanding.

## 2018-07-20 ENCOUNTER — Encounter: Payer: Self-pay | Admitting: Family Medicine

## 2018-07-21 MED ORDER — ICOSAPENT ETHYL 1 G PO CAPS: 1.0000 | ORAL_CAPSULE | Freq: Two times a day (BID) | ORAL | 6 refills | Status: DC

## 2018-07-25 DIAGNOSIS — K5190 Ulcerative colitis, unspecified, without complications: Secondary | ICD-10-CM | POA: Diagnosis not present

## 2018-07-25 DIAGNOSIS — K210 Gastro-esophageal reflux disease with esophagitis: Secondary | ICD-10-CM | POA: Diagnosis not present

## 2018-07-25 DIAGNOSIS — Z79899 Other long term (current) drug therapy: Secondary | ICD-10-CM | POA: Diagnosis not present

## 2018-07-25 DIAGNOSIS — Z5181 Encounter for therapeutic drug level monitoring: Secondary | ICD-10-CM | POA: Diagnosis not present

## 2018-07-25 DIAGNOSIS — K760 Fatty (change of) liver, not elsewhere classified: Secondary | ICD-10-CM | POA: Diagnosis not present

## 2018-07-25 LAB — LIPID PANEL
Cholesterol: 219 — AB (ref 0–200)
HDL: 26 — AB (ref 35–70)
LDL Cholesterol: 75
TRIGLYCERIDES: 522 — AB (ref 40–160)

## 2018-07-25 LAB — BASIC METABOLIC PANEL
BUN: 16 (ref 4–21)
CREATININE: 1 (ref 0.6–1.3)
Glucose: 104
POTASSIUM: 4.1 (ref 3.4–5.3)
Sodium: 141 (ref 137–147)

## 2018-07-25 LAB — CBC AND DIFFERENTIAL
HCT: 43 (ref 41–53)
Hemoglobin: 15.5 (ref 13.5–17.5)
Platelets: 197 (ref 150–399)
WBC: 7.2

## 2018-07-25 LAB — HEPATIC FUNCTION PANEL
ALT: 30 (ref 10–40)
AST: 34 (ref 14–40)
Alkaline Phosphatase: 62 (ref 25–125)

## 2018-07-25 LAB — POCT INR: INR: 1 (ref 0.9–1.1)

## 2018-08-02 ENCOUNTER — Telehealth: Payer: Self-pay | Admitting: Family Medicine

## 2018-08-02 NOTE — Telephone Encounter (Signed)
Labs received from [facility]. Labs work-up for liver inflammation does not show any obvious cause aside from nonalcoholic steatohepatitis.  However cholesterol remains quite poor.  Triglycerides are very elevated.  Are you taking the Vascepa?

## 2018-08-03 MED ORDER — ICOSAPENT ETHYL 1 G PO CAPS: 2.0000 | ORAL_CAPSULE | Freq: Two times a day (BID) | ORAL | 6 refills | Status: DC

## 2018-08-03 NOTE — Telephone Encounter (Signed)
Increasing dose of the Vascepa to 2 g twice daily.  Significant hypertriglyceridemia.  See recent labs from [facility] if needing prior authorization.  Labs were done fasting.   Care Everywhere Result Report Lipid PanelResulted: 07/26/2018 2:35 PM [facility] Component Name Value Ref Range  Cholesterol, Total 219 (H) 100 - 199 mg/dL  Triglycerides 522 (H) 0 - 149 mg/dL  HDL 26 (L) >39 mg/dL  LDL Comment  Comment: Triglyceride result indicated is too high for an accurate LDL cholesterol estimation. 0 - 99 mg/dL  Result Narrative  Performed at: [address], [place], [place] [id number] Lab Director: Rush Farmer MD, Phone: [phone]

## 2018-08-04 NOTE — Telephone Encounter (Signed)
Spoke to patient gave him advise as noted below. Rickard Kennerly,CMA  

## 2018-09-07 ENCOUNTER — Encounter: Payer: Self-pay | Admitting: Family Medicine

## 2018-09-07 LAB — ANA BY IFA, IGG
ALBUMIN/GLOBULIN RATIO: 1.8
ANA BY IFA SDFS: NEGATIVE
Albumin: 4.5
CALCIUM: 9.3
CO2: 20
CRP: 1
Ceruloplasmin: 22.4
Chloride: 104
FERRITIN: 164
GFR CALC NON AF AMER: 89
Globulin, Total: 2.5
Iron Saturation: 36
Iron: 111
TIBC: 312
TOTAL PROTEIN: 7 (ref 6.4–8.2)
UIBC: 201
URIC ACID, SERUM: 5.5

## 2018-11-30 ENCOUNTER — Encounter: Payer: Self-pay | Admitting: Family Medicine

## 2018-12-01 ENCOUNTER — Encounter: Payer: Self-pay | Admitting: Family Medicine

## 2018-12-01 ENCOUNTER — Other Ambulatory Visit: Payer: Self-pay

## 2018-12-01 ENCOUNTER — Ambulatory Visit: Payer: BLUE CROSS/BLUE SHIELD | Admitting: Family Medicine

## 2018-12-01 ENCOUNTER — Ambulatory Visit: Payer: BLUE CROSS/BLUE SHIELD | Admitting: Physician Assistant

## 2018-12-01 VITALS — BP 116/74 | HR 95 | Temp 98.3°F | Ht 75.0 in | Wt 225.0 lb

## 2018-12-01 DIAGNOSIS — R21 Rash and other nonspecific skin eruption: Secondary | ICD-10-CM | POA: Diagnosis not present

## 2018-12-01 MED ORDER — DOXYCYCLINE HYCLATE 100 MG PO TABS: 100.0000 mg | ORAL_TABLET | Freq: Two times a day (BID) | ORAL | 0 refills | Status: DC

## 2018-12-01 NOTE — Progress Notes (Signed)
Acute Office Visit  Subjective:    Patient ID: Charles Novak, male    DOB: 09-08-1975, 43 y.o.   MRN: [id number]  Chief Complaint  Patient presents with  . Rash    pt has rash x 1 month on lateral side of his L ankle he stated that this started out as a quarter size. he used an OTC antifungal cream and then switched over to using clear nail polish x 1 wk this made the area larger. he has been using peroxide and neosporin. he said that the area is itchy. he told me that he only remembers going to a friends house that has a dog     HPI Patient is in today for Rash.  He says it started about 6 weeks ago.  He thought initially it was ringworm so he treated it with a topical antifungal.  He thinks it was Tinactin AF but is not 100% sure.  He said he used it for a month at that point it really was not getting any better so he tried clear nail polish which a friend in the [employer] had suggested to him for about a week.  It did not help and it actually continued to get larger.  Over the last week he has been using Neosporin because it started to ooze what look like clear fluid but then when it would dry it with turn a yellow/brown color.  He is also been cleaning it with peroxide.  He says it is itchy and sometimes will scratch at it in his sleep.  It has been scabbing in the center.  No worsening or alleviating factors.  No fevers or chills.  No prior history of eczema or psoriasis.  No family history of psoriasis.   Past Medical History:  Diagnosis Date  . Ulcerative colitis ([place]) 1991    Past Surgical History:  Procedure Laterality Date  . WISDOM TOOTH EXTRACTION  07/1997    Family History  Problem Relation Age of Onset  . Leukemia Unknown        grandmother   . Heart attack Unknown        grandfather  . Depression Unknown        brother   . Diabetes Unknown        parents  . Hyperlipidemia Father   . Hypertension Father     Social History   Socioeconomic History  . Marital  status: Single    Spouse name: Not on file  . Number of children: Not on file  . Years of education: Not on file  . Highest education level: Not on file  Occupational History  . Not on file  Social Needs  . Financial resource strain: Not on file  . Food insecurity:    Worry: Not on file    Inability: Not on file  . Transportation needs:    Medical: Not on file    Non-medical: Not on file  Tobacco Use  . Smoking status: Former Smoker    Last attempt to quit: 04/25/2008    Years since quitting: 10.6  . Smokeless tobacco: Never Used  Substance and Sexual Activity  . Alcohol use: Yes    Alcohol/week: 3.0 - 5.0 standard drinks    Types: 3 - 5 Standard drinks or equivalent per week  . Drug use: No  . Sexual activity: Not Currently    Partners: Female  Lifestyle  . Physical activity:    Days per week: Not on file  Minutes per session: Not on file  . Stress: Not on file  Relationships  . Social connections:    Talks on phone: Not on file    Gets together: Not on file    Attends religious service: Not on file    Active member of club or organization: Not on file    Attends meetings of clubs or organizations: Not on file    Relationship status: Not on file  . Intimate partner violence:    Fear of current or ex partner: Not on file    Emotionally abused: Not on file    Physically abused: Not on file    Forced sexual activity: Not on file  Other Topics Concern  . Not on file  Social History Narrative  . Not on file    Outpatient Medications Prior to Visit  Medication Sig Dispense Refill  . albuterol (PROVENTIL HFA;VENTOLIN HFA) 108 (90 Base) MCG/ACT inhaler Inhale 2 puffs into the lungs every 6 (six) hours as needed for wheezing. 2 Inhaler 11  . allopurinol (ZYLOPRIM) 300 MG tablet Take 1 tablet (300 mg total) by mouth daily. 90 tablet 3  . balsalazide (COLAZAL) 750 MG capsule Take 3 capsules (2,250 mg total) by mouth 3 (three) times daily.    . diphenoxylate-atropine  (LOMOTIL) 2.5-0.025 MG/5ML liquid Take by mouth 4 (four) times daily as needed.    Vanessa Kick Ethyl (VASCEPA) 1 g CAPS Take 2 capsules (2 g total) by mouth 2 (two) times daily. 120 capsule 6  . pantoprazole (PROTONIX) 40 MG tablet Take 1 tablet (40 mg total) by mouth daily. 30 tablet 3   No facility-administered medications prior to visit.     Allergies  Allergen Reactions  . Amoxicillin Itching, Rash and Swelling    ROS     Objective:    Physical Exam  Constitutional: He is oriented to person, place, and time. He appears well-developed and well-nourished.  HENT:  Head: Normocephalic and atraumatic.  Eyes: Conjunctivae and EOM are normal.  Cardiovascular: Normal rate.  Pulmonary/Chest: Effort normal.  Neurological: He is alert and oriented to person, place, and time.  Skin: Skin is dry. No pallor.  He had an erythematous raised circular lesion measuring approximately 5 x 5.2 cm that was well demarcated.  There was some scabbing in the center with some thick scale around that.  When I did the skin scraping he actually had some pinpoint bleeding which is more common with psoriasis.  Psychiatric: He has a normal mood and affect. His behavior is normal.  Vitals reviewed.   BP 116/74   Pulse 95   Temp 98.3 F (36.8 C)   Ht 6' 3"  (1.905 m)   Wt 225 lb (102.1 kg)   SpO2 95%   BMI 28.12 kg/m  Wt Readings from Last 3 Encounters:  12/01/18 225 lb (102.1 kg)  07/19/18 240 lb (108.9 kg)  09/22/17 229 lb (103.9 kg)    There are no preventive care reminders to display for this patient.  There are no preventive care reminders to display for this patient.   Lab Results  Component Value Date   TSH 1.46 12/06/2016   Lab Results  Component Value Date   WBC 7.2 07/25/2018   HGB 15.5 07/25/2018   HCT 43 07/25/2018   MCV 84.3 06/06/2017   PLT 197 07/25/2018   Lab Results  Component Value Date   NA 141 07/25/2018   K 4.1 07/25/2018   CO2 20 07/25/2018   GLUCOSE 111 (  H)  06/06/2017   BUN 16 07/25/2018   CREATININE 1.0 07/25/2018   BILITOT 0.8 06/06/2017   ALKPHOS 62 07/25/2018   AST 34 07/25/2018   ALT 30 07/25/2018   PROT 7.0 07/25/2018   ALBUMIN 4.5 07/25/2018   CALCIUM 9.3 07/25/2018   Lab Results  Component Value Date   CHOL 219 (A) 07/25/2018   Lab Results  Component Value Date   HDL 26 (A) 07/25/2018   Lab Results  Component Value Date   LDLCALC 75 07/25/2018   Lab Results  Component Value Date   TRIG 522 (A) 07/25/2018   Lab Results  Component Value Date   CHOLHDL 8.5 (H) 06/06/2017   Lab Results  Component Value Date   HGBA1C 5.1 12/06/2016       Assessment & Plan:   Problem List Items Addressed This Visit    None    Visit Diagnoses    Rash    -  Primary     Based on the appearance of the lesion I do not think that it is typical ringworm it does not have central clearing infected has some scabbing in the central center though that could definitely be from excoriation.  It sounds like he has been having some honey colored crusting I did not see it on exam today.  So I am going to cover for staph infection with doxycycline.  And did a skin scraping.  will call with results once available which will likely be Monday.  Also consider alternative diagnoses including psoriasis..   Meds ordered this encounter  Medications  . doxycycline (VIBRA-TABS) 100 MG tablet    Sig: Take 1 tablet (100 mg total) by mouth 2 (two) times daily.    Dispense:  20 tablet    Refill:  0     Beatrice Lecher, MD

## 2018-12-04 LAB — FUNGAL STAIN
FUNGAL SMEAR:: NONE SEEN
MICRO NUMBER:: 374051
SPECIMEN QUALITY:: ADEQUATE

## 2018-12-04 MED ORDER — CLOBETASOL PROPIONATE 0.025 % EX CREA: 1.0000 "application " | TOPICAL_CREAM | Freq: Every day | CUTANEOUS | 0 refills | Status: DC

## 2018-12-04 NOTE — Addendum Note (Signed)
Addended by: Beatrice Lecher D on: 12/04/2018 04:35 PM   Modules accepted: Orders

## 2018-12-05 ENCOUNTER — Encounter: Payer: Self-pay | Admitting: Family Medicine

## 2018-12-06 MED ORDER — TRIAMCINOLONE ACETONIDE 0.5 % EX OINT: 1.0000 "application " | TOPICAL_OINTMENT | Freq: Two times a day (BID) | CUTANEOUS | 0 refills | Status: DC

## 2018-12-06 NOTE — Telephone Encounter (Signed)
Please call pharmacy and see what they would recommend for the clobetasol cream that is similar that is cheaper.

## 2018-12-06 NOTE — Telephone Encounter (Signed)
Called pharmacy, they advised Triamcinolone cream would be a cheaper alternative.

## 2019-03-16 ENCOUNTER — Other Ambulatory Visit: Payer: Self-pay | Admitting: Family Medicine

## 2019-07-10 ENCOUNTER — Ambulatory Visit (INDEPENDENT_AMBULATORY_CARE_PROVIDER_SITE_OTHER): Payer: BC Managed Care – PPO | Admitting: Sports Medicine

## 2019-07-10 ENCOUNTER — Ambulatory Visit (INDEPENDENT_AMBULATORY_CARE_PROVIDER_SITE_OTHER): Payer: BC Managed Care – PPO

## 2019-07-10 ENCOUNTER — Encounter: Payer: Self-pay | Admitting: Sports Medicine

## 2019-07-10 ENCOUNTER — Other Ambulatory Visit: Payer: Self-pay

## 2019-07-10 DIAGNOSIS — M25562 Pain in left knee: Secondary | ICD-10-CM | POA: Diagnosis not present

## 2019-07-10 DIAGNOSIS — D1621 Benign neoplasm of long bones of right lower limb: Secondary | ICD-10-CM | POA: Diagnosis not present

## 2019-07-10 DIAGNOSIS — M222X2 Patellofemoral disorders, left knee: Secondary | ICD-10-CM

## 2019-07-10 MED ORDER — CELECOXIB 200 MG PO CAPS
ORAL_CAPSULE | ORAL | 2 refills | Status: DC
Start: 2019-07-10 — End: 2020-03-10

## 2019-07-10 NOTE — Progress Notes (Signed)
Subjective:    CC: L knee pain  HPI: Charles Novak is a pleasant 43 year old with a history significant for gout and UC presenting with 2 weeks of increased L knee pain which provoked by prolonged sitting or laying and relieved with movement. The pain is localized to the lateral patellar facet. He has taken ibuprofen 600 mg BID with minimal relief. He denies mechanical symptoms and radiation. He has a remote history of trauma to the knee but denies persistent symptoms. He recently began working from home and increased his periods of immobility.   I reviewed the past medical history, family history, social history, surgical history, and allergies today and no changes were needed.  Please see the problem list section below in epic for further details.  Past Medical History: Past Medical History:  Diagnosis Date  . Ulcerative colitis ([place]) 1991   Past Surgical History: Past Surgical History:  Procedure Laterality Date  . WISDOM TOOTH EXTRACTION  07/1997   Social History: Social History   Socioeconomic History  . Marital status: Single    Spouse name: Not on file  . Number of children: Not on file  . Years of education: Not on file  . Highest education level: Not on file  Occupational History  . Not on file  Social Needs  . Financial resource strain: Not on file  . Food insecurity    Worry: Not on file    Inability: Not on file  . Transportation needs    Medical: Not on file    Non-medical: Not on file  Tobacco Use  . Smoking status: Former Smoker    Quit date: 04/25/2008    Years since quitting: 11.2  . Smokeless tobacco: Never Used  Substance and Sexual Activity  . Alcohol use: Yes    Alcohol/week: 3.0 - 5.0 standard drinks    Types: 3 - 5 Standard drinks or equivalent per week  . Drug use: No  . Sexual activity: Not Currently    Partners: Female  Lifestyle  . Physical activity    Days per week: Not on file    Minutes per session: Not on file  . Stress: Not on file   Relationships  . Social Herbalist on phone: Not on file    Gets together: Not on file    Attends religious service: Not on file    Active member of club or organization: Not on file    Attends meetings of clubs or organizations: Not on file    Relationship status: Not on file  Other Topics Concern  . Not on file  Social History Narrative  . Not on file   Family History: Family History  Problem Relation Age of Onset  . Leukemia Unknown        grandmother   . Heart attack Unknown        grandfather  . Depression Unknown        brother   . Diabetes Unknown        parents  . Hyperlipidemia Father   . Hypertension Father    Allergies: Allergies  Allergen Reactions  . Amoxicillin Itching, Rash and Swelling   Medications: See med rec.  Review of Systems: No fevers, chills, night sweats, weight loss, chest pain, or shortness of breath.   Objective:    General: Well Developed, well nourished, and in no acute distress.  Neuro: Alert and oriented x3, extra-ocular muscles intact, sensation grossly intact.  HEENT: Normocephalic, atraumatic, pupils  equal round reactive to light. Skin: Warm and dry, no rashes. Cardiac: Regular rate and rhythm, no lower extremity edema.  Respiratory: Not using accessory muscles, speaking in full sentences.  Knee: Normal to inspection with no erythema or effusion or obvious bony abnormalities. Palpation normal with no warmth, joint line tenderness, patellar tenderness, or condyle tenderness. ROM full in flexion and extension and lower leg rotation. Ligaments with solid consistent endpoints including ACL, PCL, LCL, MCL. Negative Mcmurray's, Apley's, and Thessalonian tests. Non painful patellar compression. Patellar glide without crepitus. Patellar and quadriceps tendons unremarkable. Hamstring and quadriceps strength is normal.   L Knee: Normal to inspection with no erythema or effusion or obvious bony abnormalities. Palpation  normal with no warmth, joint line tenderness, or condyle tenderness. Tender to palpation over lateral patellar facet. ROM full in flexion and extension and lower leg rotation. Ligaments with solid consistent endpoints including ACL, PCL, LCL, MCL. Negative Mcmurray's. Non painful patellar compression. Patellar glide without crepitus. Patellar and quadriceps tendons unremarkable. Hamstring and quadriceps strength is normal.   A/P: Charles Novak has new onset L knee pain that is worse with prolonged sitting or laying and relived with movement. He has had no mechanical symptoms and his exam was significant only for mild tenderness over the lateral patellar facet. Due to his poor muscular flexibility in his lower limbs and presentation this is likely due to patellofemoral syndrome. X-rays will be obtained for visualization of bony pathology. Celebrex prescribed for pain control and PT for correction of muscular strength imbalance.   Impression and Recommendations:    Patellofemoral syndrome, left Fairly benign exam, patellofemoral symptoms, worse going up and down stairs, pain at the lateral facet of the patella. X-rays, Celebrex, formal physical therapy, return in 6 weeks.   ___________________________________________ Gwen Her. Dianah Field, M.D., ABFM., CAQSM. Primary Care and Sports Medicine [facility]  Adjunct Professor of [employer]

## 2019-07-10 NOTE — Assessment & Plan Note (Signed)
Fairly benign exam, patellofemoral symptoms, worse going up and down stairs, pain at the lateral facet of the patella. X-rays, Celebrex, formal physical therapy, return in 6 weeks.

## 2019-07-18 ENCOUNTER — Ambulatory Visit (INDEPENDENT_AMBULATORY_CARE_PROVIDER_SITE_OTHER): Payer: BC Managed Care – PPO | Admitting: Rehabilitative and Restorative Service Providers"

## 2019-07-18 ENCOUNTER — Other Ambulatory Visit: Payer: Self-pay

## 2019-07-18 ENCOUNTER — Encounter: Payer: Self-pay | Admitting: Rehabilitative and Restorative Service Providers"

## 2019-07-18 DIAGNOSIS — M6281 Muscle weakness (generalized): Secondary | ICD-10-CM | POA: Diagnosis not present

## 2019-07-18 DIAGNOSIS — M25562 Pain in left knee: Secondary | ICD-10-CM

## 2019-07-18 NOTE — Patient Instructions (Signed)
Access Code: [email or web address]  URL: [email or web address]  Date: 07/18/2019  Prepared by: Rudell Cobb   Exercises Sidelying Hip Adduction - 15 reps - 2 sets - 2x daily - 7x weekly Long Sitting Straight Leg Raise with External Rotation - 15 reps - 2 sets - 2x daily - 7x weekly Lateral Step Up - 10 reps - 1 sets - 2x daily - 7x weekly Sit to Stand without Arm Support - 10 reps - 1 sets - 2x daily - 7x weekly

## 2019-07-18 NOTE — Therapy (Signed)
[facility] [address] [place], [place], [address] Phone: [phone]   Fax:  [phone]  Physical Therapy Evaluation  Patient Details  Name: Charles Novak MRN: [id number] Date of Birth: 25-May-1976 Referring Provider (PT): Silverio Decamp, MD   Encounter Date: 07/18/2019  PT End of Session - 07/18/19 0842    Visit Number  1    Number of Visits  6    Date for PT Re-Evaluation  09/01/19    Authorization Type  [employer]    PT Start Time  0803    PT Stop Time  0845    PT Time Calculation (min)  42 min    Activity Tolerance  Patient tolerated treatment well    Behavior During Therapy  [facility] for tasks assessed/performed       Past Medical History:  Diagnosis Date  . Ulcerative colitis ([place]) 1991    Past Surgical History:  Procedure Laterality Date  . WISDOM TOOTH EXTRACTION  07/1997    There were no vitals filed for this visit.   Subjective Assessment - 07/18/19 0806    Subjective  The patient began with worsening L knee pain in the past 6 weeks.  He notes a h/o L knee pain after sitting for long periods.  Severity:  6/10, irritability:  sitting for long periods and pain reduces within steps after initiating walking; nature:  throbbing pain with crepitus; stage:  acute    Pertinent History  gout, pre-diabetic (lost 40 lbs since dx), ulcerative colitis, hypercholesterolemia    Patient Stated Goals  strengthen leg to reduce pain; maintenance    Currently in Pain?  Yes    Pain Score  4     Pain Location  Knee    Pain Orientation  Left;Lateral    Pain Descriptors / Indicators  Aching    Pain Type  Acute pain    Pain Onset  More than a month ago    Pain Frequency  Intermittent    Aggravating Factors   sitting    Pain Relieving Factors  walking, movement         [facility] PT Assessment - 07/18/19 0810      Assessment   Medical Diagnosis  M22.2X2 (ICD-10-CM) - Patellofemoral syndrome, left    Referring Provider  (PT)  Silverio Decamp, MD    Onset Date/Surgical Date  --   6 weeks ago onset   Prior Therapy  none      Precautions   Precautions  None      Restrictions   Weight Bearing Restrictions  No      Balance Screen   Has the patient fallen in the past 6 months  No    Has the patient had a decrease in activity level because of a fear of falling?   No    Is the patient reluctant to leave their home because of a fear of falling?   No      Home Film/video editor residence    Living Arrangements  Other (Comment)   Girlfriend and 2 kids     Prior Function   Level of Independence  Independent    Vocation  Full time employment    Vocation Requirements  working from home and sitting for longer periods    Leisure  Got a puppy so walking the dog daily      Sensation   Light Touch  Appears Intact  ROM / Strength   AROM / PROM / Strength  AROM;Strength      AROM   Overall AROM   Within functional limits for tasks performed      Strength   Overall Strength  Within functional limits for tasks performed    Strength Assessment Site  Hip;Knee;Ankle    Right/Left Hip  Right;Left    Right Hip Flexion  5/5    Left Hip Flexion  5/5    Right/Left Knee  Right;Left    Right Knee Flexion  5/5    Right Knee Extension  5/5    Left Knee Flexion  5/5    Left Knee Extension  5/5    Right/Left Ankle  Right;Left    Right Ankle Dorsiflexion  5/5    Left Ankle Dorsiflexion  5/5      Flexibility   Soft Tissue Assessment /Muscle Length  yes    Hamstrings  WFLs    Quadriceps  WFLs    ITB  WFLs      Palpation   Patella mobility  equal bilaterally      Special Tests    Special Tests  Knee Special Tests    Knee Special tests   --   L knee:  no signs of ligamentous instability      Ambulation/Gait   Ambulation/Gait  Yes    Gait Comments  toes out with ambulation.                Objective measurements completed on examination: See above findings.       [facility] Adult PT Treatment/Exercise - 07/18/19 1253      Exercises   Exercises  Knee/Hip      Knee/Hip Exercises: Standing   Lateral Step Up  10 reps;Left    Wall Squat  5 reps    Wall Squat Limitations  limited by pain      Knee/Hip Exercises: Seated   Sit to Sand  10 reps;without UE support   with ball squeeze     Knee/Hip Exercises: Supine   Straight Leg Raise with External Rotation  Left;Strengthening;10 reps      Knee/Hip Exercises: Sidelying   Hip ABduction  Strengthening;Left;10 reps             PT Education - 07/18/19 0837    Education Details  HEP    Person(s) Educated  Patient    Methods  Explanation;Demonstration;Handout    Comprehension  Verbalized understanding;Returned demonstration          PT Long Term Goals - 07/18/19 1247      PT LONG TERM GOAL #1   Title  The patient will return demo HEP for L VMO strengthening, LE flexibility, and functional strengthening L LE.    Time  6    Period  Weeks    Target Date  09/01/19      PT LONG TERM GOAL #2   Title  The patient will report pain < or equal to 2/10 when rising from chair in lobby to walk into clinic.    Time  6    Period  Weeks    Target Date  09/01/19      PT LONG TERM GOAL #3   Title  The patient will descend steps without pain L knee.    Time  6    Period  Weeks    Target Date  09/01/19      PT LONG TERM GOAL #4   Title  The  patient will demonstrate squatting to pick up item from the floor without c/o L knee pain.    Time  6    Period  Weeks    Target Date  09/01/19             Plan - 07/18/19 1259    Clinical Impression Statement  The patient is a 43 yo male presenting to outpatient physical therapy with L knee pain worse with sitting, descending steps and squatting.   He presents with pain during wall slides in L lateral/superior aspect of the knee.  PT initiated VMO strengthening at today's visit.    Examination-Activity Limitations  Squat;Stairs     Stability/Clinical Decision Making  Stable/Uncomplicated    Clinical Decision Making  Low    Rehab Potential  Good    PT Frequency  1x / week    PT Duration  6 weeks    PT Treatment/Interventions  ADLs/Self Care Home Management;Neuromuscular re-education;Gait training;Stair training;Functional mobility training;Therapeutic activities;Therapeutic exercise;Balance training;Cryotherapy;Electrical Stimulation;Iontophoresis 4mg /ml Dexamethasone;Moist Heat;Ultrasound;Patient/family education;Manual techniques;Dry needling;Taping    PT Next Visit Plan  check HEP, progress stance stability L leg (lunge, split lunges, mini wall squat), work on descending stairs, discuss frequent breaks t/o the day.    Consulted and Agree with Plan of Care  Patient       Patient will benefit from skilled therapeutic intervention in order to improve the following deficits and impairments:  Abnormal gait, Pain, Decreased strength  Visit Diagnosis: Acute pain of left knee  Muscle weakness (generalized)     Problem List Patient Active Problem List   Diagnosis Date Noted  . Patellofemoral syndrome, left 07/10/2019  . Acute bronchitis 09/22/2017  . BMI 29.0-29.9,adult 06/06/2017  . Transaminitis 07/30/2016  . Chronic gout 07/30/2016  . Hyperlipidemia 09/10/2015  . Ulcerative colitis ([place]) 02/27/2015    [place], PT 07/18/2019, 1:15 PM  [facility] [place], [place], [address] Phone: [phone]   Fax:  [phone]  Name: Charles Novak MRN: [id number] Date of Birth: 06-Oct-1975

## 2019-07-24 ENCOUNTER — Encounter: Payer: BC Managed Care – PPO | Admitting: Rehabilitative and Restorative Service Providers"

## 2019-07-25 ENCOUNTER — Other Ambulatory Visit: Payer: Self-pay | Admitting: Family Medicine

## 2019-07-30 NOTE — Telephone Encounter (Signed)
Needs follow up in office. 

## 2019-08-03 ENCOUNTER — Encounter: Payer: BC Managed Care – PPO | Admitting: Physical Therapy

## 2019-08-03 DIAGNOSIS — Z79899 Other long term (current) drug therapy: Secondary | ICD-10-CM | POA: Diagnosis not present

## 2019-08-03 DIAGNOSIS — K5190 Ulcerative colitis, unspecified, without complications: Secondary | ICD-10-CM | POA: Diagnosis not present

## 2019-08-03 DIAGNOSIS — K219 Gastro-esophageal reflux disease without esophagitis: Secondary | ICD-10-CM | POA: Diagnosis not present

## 2019-08-03 DIAGNOSIS — Z5181 Encounter for therapeutic drug level monitoring: Secondary | ICD-10-CM | POA: Diagnosis not present

## 2019-08-20 ENCOUNTER — Ambulatory Visit: Payer: BC Managed Care – PPO | Admitting: Sports Medicine

## 2019-08-20 DIAGNOSIS — Z20828 Contact with and (suspected) exposure to other viral communicable diseases: Secondary | ICD-10-CM | POA: Diagnosis not present

## 2019-08-28 ENCOUNTER — Other Ambulatory Visit: Payer: Self-pay | Admitting: Physician Assistant

## 2019-09-18 ENCOUNTER — Other Ambulatory Visit: Payer: Self-pay

## 2019-09-18 ENCOUNTER — Ambulatory Visit (INDEPENDENT_AMBULATORY_CARE_PROVIDER_SITE_OTHER): Payer: BC Managed Care – PPO | Admitting: Medical-Surgical

## 2019-09-18 ENCOUNTER — Encounter: Payer: Self-pay | Admitting: Medical-Surgical

## 2019-09-18 VITALS — BP 102/66 | HR 83 | Temp 97.8°F | Ht 75.0 in | Wt 219.0 lb

## 2019-09-18 DIAGNOSIS — M1A00X0 Idiopathic chronic gout, unspecified site, without tophus (tophi): Secondary | ICD-10-CM | POA: Diagnosis not present

## 2019-09-18 DIAGNOSIS — E782 Mixed hyperlipidemia: Secondary | ICD-10-CM

## 2019-09-18 MED ORDER — ALLOPURINOL 300 MG PO TABS: 300.0000 mg | ORAL_TABLET | Freq: Every day | ORAL | 3 refills | Status: DC

## 2019-09-18 MED ORDER — ALLOPURINOL 300 MG PO TABS: 300.0000 mg | ORAL_TABLET | Freq: Every day | ORAL | 0 refills | Status: DC

## 2019-09-18 NOTE — Assessment & Plan Note (Signed)
Checking uric acid levels today.  Refills of allopurinol provided.  Continue to avoid high purine foods.

## 2019-09-18 NOTE — Assessment & Plan Note (Addendum)
Checking fasting lipids today.  If refills needed, patient will reach out via MyChart/telephone.  Continue low-fat diet.

## 2019-09-18 NOTE — Progress Notes (Signed)
Subjective:    CC: gout, cholesterol follow up   HPI:  44 year old male presenting today for follow-up on gout and cholesterol.  Doing well on allopurinol 300 mg daily.  No gout flares in the last 3 years.  Tries to avoid high purine foods.  Reports eating a low-fat diet.  Taking Vascepa 2 g twice daily.  Goes for daily walks with his dog for exercise.  Unsure if he needs refills at this time for Vascepa.   I reviewed the past medical history, family history, social history, surgical history, and allergies today and no changes were needed.  Please see the problem list section below in epic for further details.  Past Medical History: Past Medical History:  Diagnosis Date  . Ulcerative colitis ([place]) 1991   Past Surgical History: Past Surgical History:  Procedure Laterality Date  . WISDOM TOOTH EXTRACTION  07/1997   Social History: Social History   Socioeconomic History  . Marital status: Single    Spouse name: Not on file  . Number of children: Not on file  . Years of education: Not on file  . Highest education level: Not on file  Occupational History  . Not on file  Tobacco Use  . Smoking status: Former Smoker    Quit date: 04/25/2008    Years since quitting: 11.4  . Smokeless tobacco: Never Used  Substance and Sexual Activity  . Alcohol use: Yes    Alcohol/week: 3.0 - 5.0 standard drinks    Types: 3 - 5 Standard drinks or equivalent per week  . Drug use: No  . Sexual activity: Not Currently    Partners: Female  Other Topics Concern  . Not on file  Social History Narrative  . Not on file   Social Determinants of Health   Financial Resource Strain:   . Difficulty of Paying Living Expenses: Not on file  Food Insecurity:   . Worried About Charity fundraiser in the Last Year: Not on file  . Ran Out of Food in the Last Year: Not on file  Transportation Needs:   . Lack of Transportation (Medical): Not on file  . Lack of Transportation (Non-Medical): Not on file   Physical Activity:   . Days of Exercise per Week: Not on file  . Minutes of Exercise per Session: Not on file  Stress:   . Feeling of Stress : Not on file  Social Connections:   . Frequency of Communication with Friends and Family: Not on file  . Frequency of Social Gatherings with Friends and Family: Not on file  . Attends Religious Services: Not on file  . Active Member of Clubs or Organizations: Not on file  . Attends Archivist Meetings: Not on file  . Marital Status: Not on file   Family History: Family History  Problem Relation Age of Onset  . Leukemia Unknown        grandmother   . Heart attack Unknown        grandfather  . Depression Unknown        brother   . Diabetes Unknown        parents  . Hyperlipidemia Father   . Hypertension Father    Allergies: Allergies  Allergen Reactions  . Amoxicillin Itching, Rash and Swelling   Medications: See med rec.  Review of Systems: No fevers, chills, night sweats, weight loss, chest pain, or shortness of breath.   Objective:    General: Well Developed, well nourished, and  in no acute distress.  Neuro: Alert and oriented x3.  HEENT: Normocephalic, atraumatic.  Skin: Warm and dry. Cardiac: Regular rate and rhythm, no murmurs rubs or gallops, no lower extremity edema.  Respiratory: Clear to auscultation bilaterally. Not using accessory muscles, speaking in full sentences.   Impression and Recommendations:    Hyperlipidemia Checking fasting lipids today.  If refills needed, patient will reach out via MyChart/telephone.  Continue low-fat diet.  Chronic gout Checking uric acid levels today.  Refills of allopurinol provided.  Continue to avoid high purine foods.  Return in about 2 months (around 11/16/2019) for annual physical exam.  ___________________________________________ Clearnce Sorrel, DNP, APRN, FNP-BC [facility] and [facility]

## 2019-09-19 LAB — URIC ACID: Uric Acid, Serum: 5.2 mg/dL (ref 4.0–8.0)

## 2019-09-19 LAB — LIPID PANEL
Cholesterol: 192 mg/dL (ref <200)
HDL: 29 mg/dL — ABNORMAL LOW (ref >=40)
LDL Cholesterol (Calc): 109 mg/dL (calc) — ABNORMAL HIGH
Non-HDL Cholesterol (Calc): 163 mg/dL (calc) — ABNORMAL HIGH (ref <130)
Total CHOL/HDL Ratio: 6.6 (calc) — ABNORMAL HIGH (ref <5.0)
Triglycerides: 381 mg/dL — ABNORMAL HIGH (ref <150)

## 2019-10-15 DIAGNOSIS — Z3141 Encounter for fertility testing: Secondary | ICD-10-CM | POA: Diagnosis not present

## 2019-11-30 ENCOUNTER — Ambulatory Visit: Payer: BC Managed Care – PPO | Attending: Internal Medicine

## 2019-11-30 DIAGNOSIS — Z23 Encounter for immunization: Secondary | ICD-10-CM

## 2019-11-30 NOTE — Progress Notes (Signed)
   Covid-19 Vaccination Clinic  Name:  Charles Novak    MRN: [id number] DOB: 12/10/1975  11/30/2019  Mr. Charles Novak was observed post Covid-19 immunization for 30 minutes based on pre-vaccination screening without incident. He was provided with Vaccine Information Sheet and instruction to access the V-Safe system.   Mr. Charles Novak was instructed to call 911 with any severe reactions post vaccine: Marland Kitchen Difficulty breathing  . Swelling of face and throat  . A fast heartbeat  . A bad rash all over body  . Dizziness and weakness   Immunizations Administered    Name Date Dose VIS Date Route   Pfizer COVID-19 Vaccine 11/30/2019  8:48 AM 0.3 mL 08/10/2019 Intramuscular   Manufacturer: [place]   Lot: [id number]   [place]: [address]-1

## 2019-12-24 ENCOUNTER — Ambulatory Visit: Payer: BC Managed Care – PPO | Attending: Internal Medicine

## 2019-12-24 DIAGNOSIS — Z23 Encounter for immunization: Secondary | ICD-10-CM

## 2019-12-24 NOTE — Progress Notes (Signed)
   Covid-19 Vaccination Clinic  Name:  Imani Sherrin    MRN: [id number] DOB: [birth year]  12/24/2019  Mr. Cardin was observed post Covid-19 immunization for 15 minutes without incident. He was provided with Vaccine Information Sheet and instruction to access the V-Safe system.   Mr. Streety was instructed to call 911 with any severe reactions post vaccine: Marland Kitchen Difficulty breathing  . Swelling of face and throat  . A fast heartbeat  . A bad rash all over body  . Dizziness and weakness   Immunizations Administered    Name Date Dose VIS Date Route   Pfizer COVID-19 Vaccine 12/24/2019 11:19 AM 0.3 mL 10/24/2018 Intramuscular   Manufacturer: [place]   Lot: [id number]   [place]: [address]-7

## 2020-01-22 ENCOUNTER — Other Ambulatory Visit: Payer: Self-pay | Admitting: Sports Medicine

## 2020-01-22 DIAGNOSIS — M222X2 Patellofemoral disorders, left knee: Secondary | ICD-10-CM

## 2020-01-29 ENCOUNTER — Telehealth: Payer: Self-pay

## 2020-01-29 NOTE — Telephone Encounter (Signed)
Thanks for the FYI

## 2020-01-29 NOTE — Telephone Encounter (Signed)
Patient called reporting being woken up in the middle of the night with an intense pressure in his chest. Deep breaths hurt. He reports pain is radiating to left side and he is having intense pain I left shoulder blade area. Patient states SX have been going on for 4 hours now, no relief.   Advised patient to seek urgent care at Emergency Room.patient advised to go to closest ER. Patient agreeable, he states he is scared to drive so he will contact his fiance to transport him. I advised time was important and if she is not able to urgently take him, he is to call EMS for transportation.   Patient agreeable. FYI to PCP

## 2020-02-25 ENCOUNTER — Other Ambulatory Visit: Payer: Self-pay | Admitting: Family Medicine

## 2020-03-07 DIAGNOSIS — R0602 Shortness of breath: Secondary | ICD-10-CM | POA: Insufficient documentation

## 2020-03-10 ENCOUNTER — Encounter: Payer: Self-pay | Admitting: Physician Assistant

## 2020-03-10 ENCOUNTER — Ambulatory Visit: Payer: BC Managed Care – PPO | Admitting: Physician Assistant

## 2020-03-10 ENCOUNTER — Ambulatory Visit (INDEPENDENT_AMBULATORY_CARE_PROVIDER_SITE_OTHER): Payer: BC Managed Care – PPO

## 2020-03-10 ENCOUNTER — Other Ambulatory Visit: Payer: Self-pay

## 2020-03-10 VITALS — BP 137/81 | HR 117 | Ht 75.0 in | Wt 221.0 lb

## 2020-03-10 DIAGNOSIS — I319 Disease of pericardium, unspecified: Secondary | ICD-10-CM | POA: Diagnosis not present

## 2020-03-10 DIAGNOSIS — R071 Chest pain on breathing: Secondary | ICD-10-CM | POA: Diagnosis not present

## 2020-03-10 DIAGNOSIS — R000 Tachycardia, unspecified: Secondary | ICD-10-CM | POA: Diagnosis not present

## 2020-03-10 MED ORDER — ICOSAPENT ETHYL 1 G PO CAPS: ORAL_CAPSULE | ORAL | 11 refills | Status: DC

## 2020-03-10 NOTE — Progress Notes (Signed)
Subjective:    Patient ID: Charles Novak, male    DOB: [birth year], 44 y.o.   MRN: [id number]  HPI  Pt is a 44 yo male who presents to the clinic to follow up after ED visit on 01/29/2020.   He woke up with sudden left sided CP and went to ED. He described the pain worse when leaning back and better when leaning forward. After EKG, troponin, chest xray dx with idiopathic pericarditis. He was started on Colchincine and ibuprofen.   He was seen by cardiology last week and since there was no Pr depressions or ST elevation he does not think it was pericarditis. Echo was ordered. ESR and CRP elevated.   He thought he was getting a little better but now he is having CP all the time. Worse with breathing and laying on his left side. He is having low grade fevers of 99. No edema. He does feel easily winded.   No worsening reflux.  No worsening GI symptoms. Hx of colitis.   He has a friend who is a doctor and wanted a CBC and lymes test done. He comes here to get that.   .. Active Ambulatory Problems    Diagnosis Date Noted  . Ulcerative colitis ([place]) 02/27/2015  . Hyperlipidemia 09/10/2015  . Transaminitis 07/30/2016  . Chronic gout 07/30/2016  . BMI 29.0-29.9,adult 06/06/2017  . Acute bronchitis 09/22/2017  . Patellofemoral syndrome, left 07/10/2019  . Chronic idiopathic pericarditis 03/10/2020  . Chest pain varying with breathing 03/10/2020  . Tachycardia 03/10/2020   Resolved Ambulatory Problems    Diagnosis Date Noted  . Sinus tarsi syndrome of left ankle 04/16/2016   No Additional Past Medical History     Review of Systems  All other systems reviewed and are negative.      Objective:   Physical Exam Vitals reviewed.  Constitutional:      Appearance: He is well-developed.  HENT:     Head: Normocephalic.  Cardiovascular:     Rate and Rhythm: Regular rhythm. Tachycardia present.     Pulses:          Carotid pulses are 0 on the right side and 0 on the left side.     Heart sounds: Normal heart sounds. Heart sounds not distant.  Pulmonary:     Effort: Pulmonary effort is normal.     Breath sounds: Normal breath sounds. No decreased breath sounds, wheezing, rhonchi or rales.  Musculoskeletal:     Right lower leg: No edema.     Left lower leg: No edema.  Neurological:     General: No focal deficit present.     Mental Status: He is alert.  Psychiatric:        Mood and Affect: Mood normal.           Assessment & Plan:  Marland KitchenMarland KitchenJasman was seen today for chest pain.  Diagnoses and all orders for this visit:  Chronic idiopathic pericarditis, unspecified complication status -     TSH -     Sed Rate (ESR) -     Hepatitis C Antibody -     CBC with Differential/Platelet -     Lipase -     COMPLETE METABOLIC PANEL WITH GFR -     Cancel: Lyme Ab/Western Blot Reflex -     DG Chest 2 View -     C-reactive protein -     B. burgdorfi antibodies  Chest pain varying with breathing -  TSH -     Sed Rate (ESR) -     Hepatitis C Antibody -     CBC with Differential/Platelet -     Lipase -     COMPLETE METABOLIC PANEL WITH GFR -     Cancel: Lyme Ab/Western Blot Reflex -     DG Chest 2 View -     C-reactive protein -     B. burgdorfi antibodies  Tachycardia  Other orders -     icosapent Ethyl (VASCEPA) 1 g capsule; TAKE 2 CAPSULES (2 G TOTAL) BY MOUTH 2 (TWO) TIMES DAILY.   Certainly symptoms sound like pericarditis. No recent illness. Will get CXR to follow up after normal CXR in ED. Continue to follow up with cardiology. Get Echo. Ordered more labs to work up symptoms. Recheck ESR and CRP that were elevated.   Spent 30 minutes with patient and going through chart and ordering test.

## 2020-03-11 ENCOUNTER — Encounter: Payer: Self-pay | Admitting: Physician Assistant

## 2020-03-11 ENCOUNTER — Telehealth: Payer: BC Managed Care – PPO | Admitting: Sports Medicine

## 2020-03-11 LAB — B. BURGDORFI ANTIBODIES: B burgdorferi Ab IgG+IgM: <0.9 index

## 2020-03-11 NOTE — Progress Notes (Signed)
CRP is rapidly elevating. It was in 42s at cardiology. We need to get these results to cardiology.

## 2020-03-11 NOTE — Progress Notes (Signed)
Charles Novak,   Your chest xray did show pleural effusion that were small. That is fluid build up in lungs. The most likely cause in your case is that your heart is not pumping like it should and fluid is backing up. The echo should help diagnose that. No evidence of infection/pneumonia.   Kidney, liver, glucose look good.  Thyroid is perfect.   Your hemoglobin has dropped out of normal range. Lets add ferritin and iron panel to better evaluate.  Sed rate is still elevated.   Some labs pending.

## 2020-03-11 NOTE — Telephone Encounter (Signed)
He has been admitted.

## 2020-03-11 NOTE — Progress Notes (Signed)
No lymes antibodies.

## 2020-03-12 LAB — CBC WITH DIFFERENTIAL/PLATELET
Absolute Monocytes: 700 cells/uL (ref 200–950)
Basophils Absolute: 42 cells/uL (ref 0–200)
Basophils Relative: 0.4 %
Eosinophils Absolute: 318 cells/uL (ref 15–500)
Eosinophils Relative: 3 %
HCT: 38.2 % — ABNORMAL LOW (ref 38.5–50.0)
Hemoglobin: 12.9 g/dL — ABNORMAL LOW (ref 13.2–17.1)
Lymphs Abs: 2056 cells/uL (ref 850–3900)
MCH: 28.5 pg (ref 27.0–33.0)
MCHC: 33.8 g/dL (ref 32.0–36.0)
MCV: 84.3 fL (ref 80.0–100.0)
MPV: 10 fL (ref 7.5–12.5)
Monocytes Relative: 6.6 %
Neutro Abs: 7484 cells/uL (ref 1500–7800)
Neutrophils Relative %: 70.6 %
Platelets: 376 10*3/uL (ref 140–400)
RBC: 4.53 10*6/uL (ref 4.20–5.80)
RDW: 12.6 % (ref 11.0–15.0)
Total Lymphocyte: 19.4 %
WBC: 10.6 10*3/uL (ref 3.8–10.8)

## 2020-03-12 LAB — LIPASE: Lipase: 8 U/L (ref 7–60)

## 2020-03-12 LAB — COMPLETE METABOLIC PANEL WITH GFR
AG Ratio: 1.4 (calc) (ref 1.0–2.5)
ALT: 19 U/L (ref 9–46)
AST: 23 U/L (ref 10–40)
Albumin: 4 g/dL (ref 3.6–5.1)
Alkaline phosphatase (APISO): 117 U/L (ref 36–130)
BUN: 19 mg/dL (ref 7–25)
CO2: 27 mmol/L (ref 20–32)
Calcium: 9 mg/dL (ref 8.6–10.3)
Chloride: 100 mmol/L (ref 98–110)
Creat: 0.94 mg/dL (ref 0.60–1.35)
GFR, Est African American: 115 mL/min/{1.73_m2} (ref >=60)
GFR, Est Non African American: 99 mL/min/{1.73_m2} (ref >=60)
Globulin: 2.8 g/dL (calc) (ref 1.9–3.7)
Glucose, Bld: 99 mg/dL (ref 65–99)
Potassium: 4.2 mmol/L (ref 3.5–5.3)
Sodium: 137 mmol/L (ref 135–146)
Total Bilirubin: 0.9 mg/dL (ref 0.2–1.2)
Total Protein: 6.8 g/dL (ref 6.1–8.1)

## 2020-03-12 LAB — TSH: TSH: 1.86 mIU/L (ref 0.40–4.50)

## 2020-03-12 LAB — SEDIMENTATION RATE: Sed Rate: 43 mm/h — ABNORMAL HIGH (ref 0–15)

## 2020-03-12 LAB — HEPATITIS C ANTIBODY
Hepatitis C Ab: NONREACTIVE
SIGNAL TO CUT-OFF: 0.01 (ref <1.00)

## 2020-03-12 LAB — IRON,TIBC AND FERRITIN PANEL
%SAT: 7 % (calc) — ABNORMAL LOW (ref 20–48)
Ferritin: 337 ng/mL (ref 38–380)
Iron: 18 ug/dL — ABNORMAL LOW (ref 50–180)
TIBC: 268 mcg/dL (calc) (ref 250–425)

## 2020-03-12 LAB — C-REACTIVE PROTEIN: CRP: 130.4 mg/L — ABNORMAL HIGH (ref <8.0)

## 2020-03-14 DIAGNOSIS — I300 Acute nonspecific idiopathic pericarditis: Secondary | ICD-10-CM | POA: Insufficient documentation

## 2020-03-14 DIAGNOSIS — I3139 Other pericardial effusion (noninflammatory): Secondary | ICD-10-CM | POA: Insufficient documentation

## 2020-04-28 ENCOUNTER — Telehealth: Payer: Self-pay | Admitting: Medical-Surgical

## 2020-04-28 NOTE — Telephone Encounter (Signed)
Received fax for PA on Vascepa sent through cover my meds waiting for determination. - CF

## 2020-04-30 NOTE — Telephone Encounter (Signed)
Received fax from [facility] stating they cancelled request for Vascepa due to it does not require clinical review it is on the plan of covered drugs. - CF

## 2020-05-20 ENCOUNTER — Telehealth: Payer: Self-pay | Admitting: Medical-Surgical

## 2020-05-20 NOTE — Telephone Encounter (Signed)
Received fax for PA on Vascepa sent through cover my meds waiting on determination. - CF

## 2020-05-22 NOTE — Telephone Encounter (Signed)
Received fax from [facility] they cancelled PA due to medication is on covered drug list and currently does not require review. - CF

## 2020-07-21 LAB — HM COLONOSCOPY

## 2020-09-15 ENCOUNTER — Encounter: Payer: Self-pay | Admitting: Medical-Surgical

## 2020-09-24 ENCOUNTER — Other Ambulatory Visit: Payer: Self-pay | Admitting: Medical-Surgical

## 2020-09-24 DIAGNOSIS — M1A00X0 Idiopathic chronic gout, unspecified site, without tophus (tophi): Secondary | ICD-10-CM

## 2020-10-19 ENCOUNTER — Other Ambulatory Visit: Payer: Self-pay | Admitting: Medical-Surgical

## 2020-10-19 DIAGNOSIS — M1A00X0 Idiopathic chronic gout, unspecified site, without tophus (tophi): Secondary | ICD-10-CM

## 2020-11-10 ENCOUNTER — Ambulatory Visit: Payer: BC Managed Care – PPO | Admitting: Medical-Surgical

## 2020-11-10 ENCOUNTER — Other Ambulatory Visit: Payer: Self-pay

## 2020-11-10 ENCOUNTER — Encounter: Payer: Self-pay | Admitting: Medical-Surgical

## 2020-11-10 VITALS — BP 100/70 | HR 81 | Temp 98.0°F | Ht 75.0 in | Wt 233.4 lb

## 2020-11-10 DIAGNOSIS — Z8639 Personal history of other endocrine, nutritional and metabolic disease: Secondary | ICD-10-CM

## 2020-11-10 DIAGNOSIS — E782 Mixed hyperlipidemia: Secondary | ICD-10-CM

## 2020-11-10 DIAGNOSIS — M1A00X0 Idiopathic chronic gout, unspecified site, without tophus (tophi): Secondary | ICD-10-CM

## 2020-11-10 MED ORDER — ICOSAPENT ETHYL 1 G PO CAPS: 2.0000 g | ORAL_CAPSULE | Freq: Two times a day (BID) | ORAL | 1 refills | Status: DC

## 2020-11-10 MED ORDER — ALLOPURINOL 300 MG PO TABS: 300.0000 mg | ORAL_TABLET | Freq: Every day | ORAL | 1 refills | Status: DC

## 2020-11-10 NOTE — Progress Notes (Signed)
Subjective:    CC: gout/cholesterol follow up  HPI: Pleasant 45 year old male presenting for follow up on gout and cholesterol.   Gout- taking Allopurinol 348m daily, tolerating well without side effects. Notes that he has not had any flares since he started the medication.   Cholesterol- taking Vascepa 2g BID, tolerating well without side effects.   Was hospitalized last year with pericarditis and required a drain to pull 1L of fluid from around his heart. Doing well now with only some intermittent residual sensations at the area where the drain was.   I reviewed the past medical history, family history, social history, surgical history, and allergies today and no changes were needed.  Please see the problem list section below in epic for further details.  Past Medical History: Past Medical History:  Diagnosis Date  . [place]   Past Surgical History: Past Surgical History:  Procedure Laterality Date  . WISDOM TOOTH EXTRACTION  07/1997   Social History: Social History   Socioeconomic History  . Marital status: Single    Spouse name: Not on file  . Number of children: Not on file  . Years of education: Not on file  . Highest education level: Not on file  Occupational History  . Not on file  Tobacco Use  . Smoking status: Former Smoker    Quit date: 04/25/2008    Years since quitting: 12.5  . Smokeless tobacco: Never Used  Substance and Sexual Activity  . Alcohol use: Yes    Alcohol/week: 3.0 - 5.0 standard drinks    Types: 3 - 5 Standard drinks or equivalent per week  . Drug use: No  . Sexual activity: Not Currently    Partners: Female  Other Topics Concern  . Not on file  Social History Narrative  . Not on file   Social Determinants of Health   Financial Resource Strain: Not on file  Food Insecurity: Not on file  Transportation Needs: Not on file  Physical Activity: Not on file  Stress: Not on file  Social Connections: Not on file    Family History: Family History  Problem Relation Age of Onset  . Leukemia Unknown        grandmother   . Heart attack Unknown        grandfather  . Depression Unknown        brother   . Diabetes Unknown        parents  . Hyperlipidemia Father   . Hypertension Father    Allergies: Allergies  Allergen Reactions  . Sucralose Swelling    throat throat   . Amoxicillin Itching, Rash and Swelling   Medications: See med rec.  Review of Systems: See HPI for pertinent positives and negatives.   Objective:    General: Well Developed, well nourished, and in no acute distress.  Neuro: Alert and oriented x3.  HEENT: Normocephalic, atraumatic.  Skin: Warm and dry. Cardiac: Regular rate and rhythm, no murmurs rubs or gallops, no lower extremity edema.  Respiratory: Clear to auscultation bilaterally. Not using accessory muscles, speaking in full sentences.  Impression and Recommendations:    1. Idiopathic chronic gout without tophus, unspecified site Continue Allopurinol, refill sent. Checking CMP.  - allopurinol (ZYLOPRIM) 300 MG tablet; Take 1 tablet (300 mg total) by mouth daily.  Dispense: 90 tablet; Refill: 1 - COMPLETE METABOLIC PANEL WITH GFR  2. Mixed hyperlipidemia Continue Vascepa, refill sent. Checking lipid panel.  - Lipid panel  3. History of iron  deficiency Labs from hospitalization showed mild anemia with low iron. Rechecking CBC w/diff and iron panel today.  - Fe+TIBC+Fer - CBC with Differential/Platelet  Return in about 6 months (around 05/13/2021) for gout/HLD follow up. ___________________________________________ Clearnce Sorrel, DNP, APRN, FNP-BC Primary [facility] and [place]

## 2020-11-11 LAB — IRON,TIBC AND FERRITIN PANEL
%SAT: 36 % (calc) (ref 20–48)
Ferritin: 67 ng/mL (ref 38–380)
Iron: 123 ug/dL (ref 50–180)
TIBC: 338 mcg/dL (calc) (ref 250–425)

## 2020-11-11 LAB — CBC WITH DIFFERENTIAL/PLATELET
Absolute Monocytes: 428 cells/uL (ref 200–950)
Basophils Absolute: 64 cells/uL (ref 0–200)
Basophils Relative: 0.7 %
Eosinophils Absolute: 319 cells/uL (ref 15–500)
Eosinophils Relative: 3.5 %
HCT: 49 % (ref 38.5–50.0)
Hemoglobin: 16.8 g/dL (ref 13.2–17.1)
Lymphs Abs: 2939 cells/uL (ref 850–3900)
MCH: 29 pg (ref 27.0–33.0)
MCHC: 34.3 g/dL (ref 32.0–36.0)
MCV: 84.6 fL (ref 80.0–100.0)
MPV: 10.3 fL (ref 7.5–12.5)
Monocytes Relative: 4.7 %
Neutro Abs: 5351 cells/uL (ref 1500–7800)
Neutrophils Relative %: 58.8 %
Platelets: 230 10*3/uL (ref 140–400)
RBC: 5.79 10*6/uL (ref 4.20–5.80)
RDW: 13 % (ref 11.0–15.0)
Total Lymphocyte: 32.3 %
WBC: 9.1 10*3/uL (ref 3.8–10.8)

## 2020-11-11 LAB — COMPLETE METABOLIC PANEL WITH GFR
AG Ratio: 1.8 (calc) (ref 1.0–2.5)
ALT: 19 U/L (ref 9–46)
AST: 21 U/L (ref 10–40)
Albumin: 4.7 g/dL (ref 3.6–5.1)
Alkaline phosphatase (APISO): 58 U/L (ref 36–130)
BUN: 21 mg/dL (ref 7–25)
CO2: 29 mmol/L (ref 20–32)
Calcium: 9.6 mg/dL (ref 8.6–10.3)
Chloride: 102 mmol/L (ref 98–110)
Creat: 1.02 mg/dL (ref 0.60–1.35)
GFR, Est African American: 103 mL/min/{1.73_m2} (ref >=60)
GFR, Est Non African American: 89 mL/min/{1.73_m2} (ref >=60)
Globulin: 2.6 g/dL (calc) (ref 1.9–3.7)
Glucose, Bld: 103 mg/dL — ABNORMAL HIGH (ref 65–99)
Potassium: 4.7 mmol/L (ref 3.5–5.3)
Sodium: 139 mmol/L (ref 135–146)
Total Bilirubin: 0.8 mg/dL (ref 0.2–1.2)
Total Protein: 7.3 g/dL (ref 6.1–8.1)

## 2020-11-11 LAB — LIPID PANEL
Cholesterol: 196 mg/dL (ref <200)
HDL: 30 mg/dL — ABNORMAL LOW (ref >=40)
Non-HDL Cholesterol (Calc): 166 mg/dL (calc) — ABNORMAL HIGH (ref <130)
Total CHOL/HDL Ratio: 6.5 (calc) — ABNORMAL HIGH (ref <5.0)
Triglycerides: 541 mg/dL — ABNORMAL HIGH (ref <150)

## 2020-11-12 MED ORDER — GEMFIBROZIL 600 MG PO TABS: 600.0000 mg | ORAL_TABLET | Freq: Two times a day (BID) | ORAL | 2 refills | Status: DC

## 2020-11-12 NOTE — Addendum Note (Signed)
Addended bySamuel Bouche on: 11/12/2020 12:58 PM   Modules accepted: Orders

## 2021-02-01 ENCOUNTER — Other Ambulatory Visit: Payer: Self-pay | Admitting: Medical-Surgical

## 2021-05-13 ENCOUNTER — Encounter: Payer: Self-pay | Admitting: Medical-Surgical

## 2021-05-13 ENCOUNTER — Other Ambulatory Visit: Payer: Self-pay

## 2021-05-13 ENCOUNTER — Ambulatory Visit: Payer: BC Managed Care – PPO | Admitting: Medical-Surgical

## 2021-05-13 VITALS — BP 112/79 | HR 81 | Resp 20 | Ht 75.0 in | Wt 228.0 lb

## 2021-05-13 DIAGNOSIS — M1A00X0 Idiopathic chronic gout, unspecified site, without tophus (tophi): Secondary | ICD-10-CM

## 2021-05-13 DIAGNOSIS — R7401 Elevation of levels of liver transaminase levels: Secondary | ICD-10-CM

## 2021-05-13 DIAGNOSIS — E782 Mixed hyperlipidemia: Secondary | ICD-10-CM | POA: Diagnosis not present

## 2021-05-13 DIAGNOSIS — D1801 Hemangioma of skin and subcutaneous tissue: Secondary | ICD-10-CM

## 2021-05-13 DIAGNOSIS — K5100 Ulcerative (chronic) pancolitis without complications: Secondary | ICD-10-CM

## 2021-05-13 DIAGNOSIS — Z8639 Personal history of other endocrine, nutritional and metabolic disease: Secondary | ICD-10-CM | POA: Insufficient documentation

## 2021-05-13 NOTE — Progress Notes (Signed)
  HPI with pertinent ROS:   CC: Gout/cholesterol follow-up  HPI: Pleasant 45 year old male presenting today for the following:  Gout: Taking allopurinol 300 mg daily, tolerating well without side effects.  No gout flares since her last visit.  Hyperlipidemia: Taking gemfibrozil 600 mg twice daily before meals, tolerating well.  Notes that he has had some worsening distance vision and wonders if this may be related to the medication.  Has an upcoming eye appointment to evaluate.  History of transaminitis: No urinary discoloration, muscle tenderness/pain.  No skin or scleral jaundice.  Ulcerative colitis: Followed by GI.  Has had occasional abdominal pain over the last month but has an appointment to have this checked soon.  Skin lesions-has developed 2 small skin lesions that are bright red in nature, nonpruritic.  Has several moles that are not concerning but wonders what these new skin lesions are.  Does have family members that have similar lesions.  I reviewed the past medical history, family history, social history, surgical history, and allergies today and no changes were needed.  Please see the problem list section below in epic for further details.   Physical exam:   General: Well Developed, well nourished, and in no acute distress.  Neuro: Alert and oriented x3.  HEENT: Normocephalic, atraumatic.  Skin: Warm and dry.  1 small 3 mm cherry hemangioma to the left chest, 1 mm cherry hemangioma to the left upper knee. Cardiac: Regular rate and rhythm, no murmurs rubs or gallops, no lower extremity edema.  Respiratory: Clear to auscultation bilaterally. Not using accessory muscles, speaking in full sentences.  Impression and Recommendations:    1. Idiopathic chronic gout without tophus, unspecified site Checking uric acid and CMP.   Continue allopurinol. - Uric acid - COMPLETE METABOLIC PANEL WITH GFR  2. Mixed hyperlipidemia Checking lipid panel. Continue gemfibrozil. -  Lipid panel  3. Transaminitis Checking CMP. - COMPLETE METABOLIC PANEL WITH GFR  4. Ulcerative pancolitis without complication (HCC) Checking CBC. - CBC  5. Cherry hemangioma Procedure: Cryodestruction of: One 3 mm lesion to the left chest along the lower ribs. Consent obtained and verified. Time-out conducted. Noted no overlying erythema, induration, or other signs of local infection. Completed without difficulty using Cryo-Gun. Advised to call if fevers/chills, erythema, induration, drainage, or persistent bleeding.  Return in about 6 months (around 11/10/2021) for gout/HLD follow up. ___________________________________________ Clearnce Sorrel, DNP, APRN, FNP-BC [facility]

## 2021-05-14 IMAGING — DX DG KNEE 1-2V*R*
2 series · 2 of 2 positions shown · non-contrast
Comparison: None.

CLINICAL DATA: No known injury, left knee pain.

EXAM:
RIGHT KNEE - 1-2 VIEW

[knee ap bilat standing (1 of 2)]
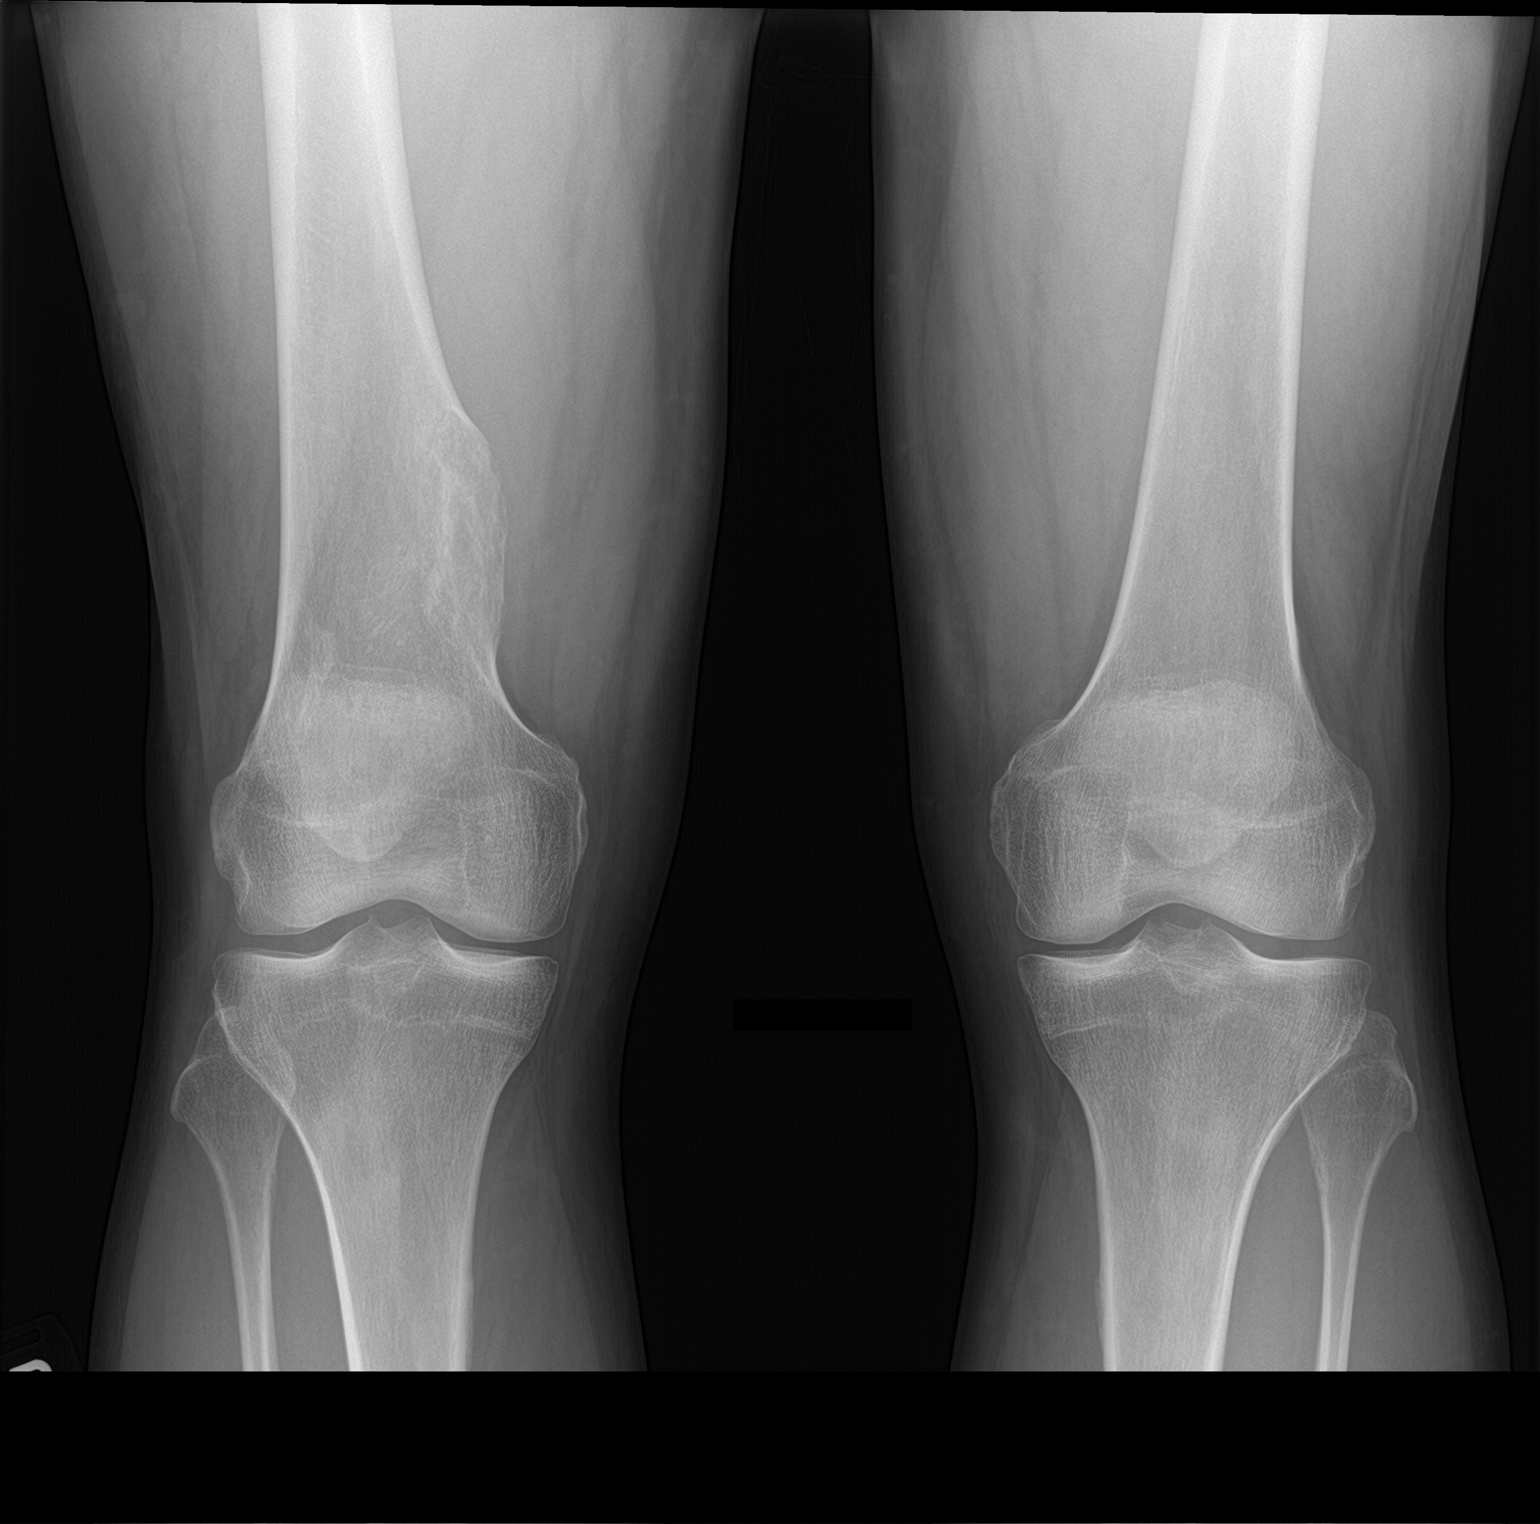

[knee ap bilat standing (2 of 2)]
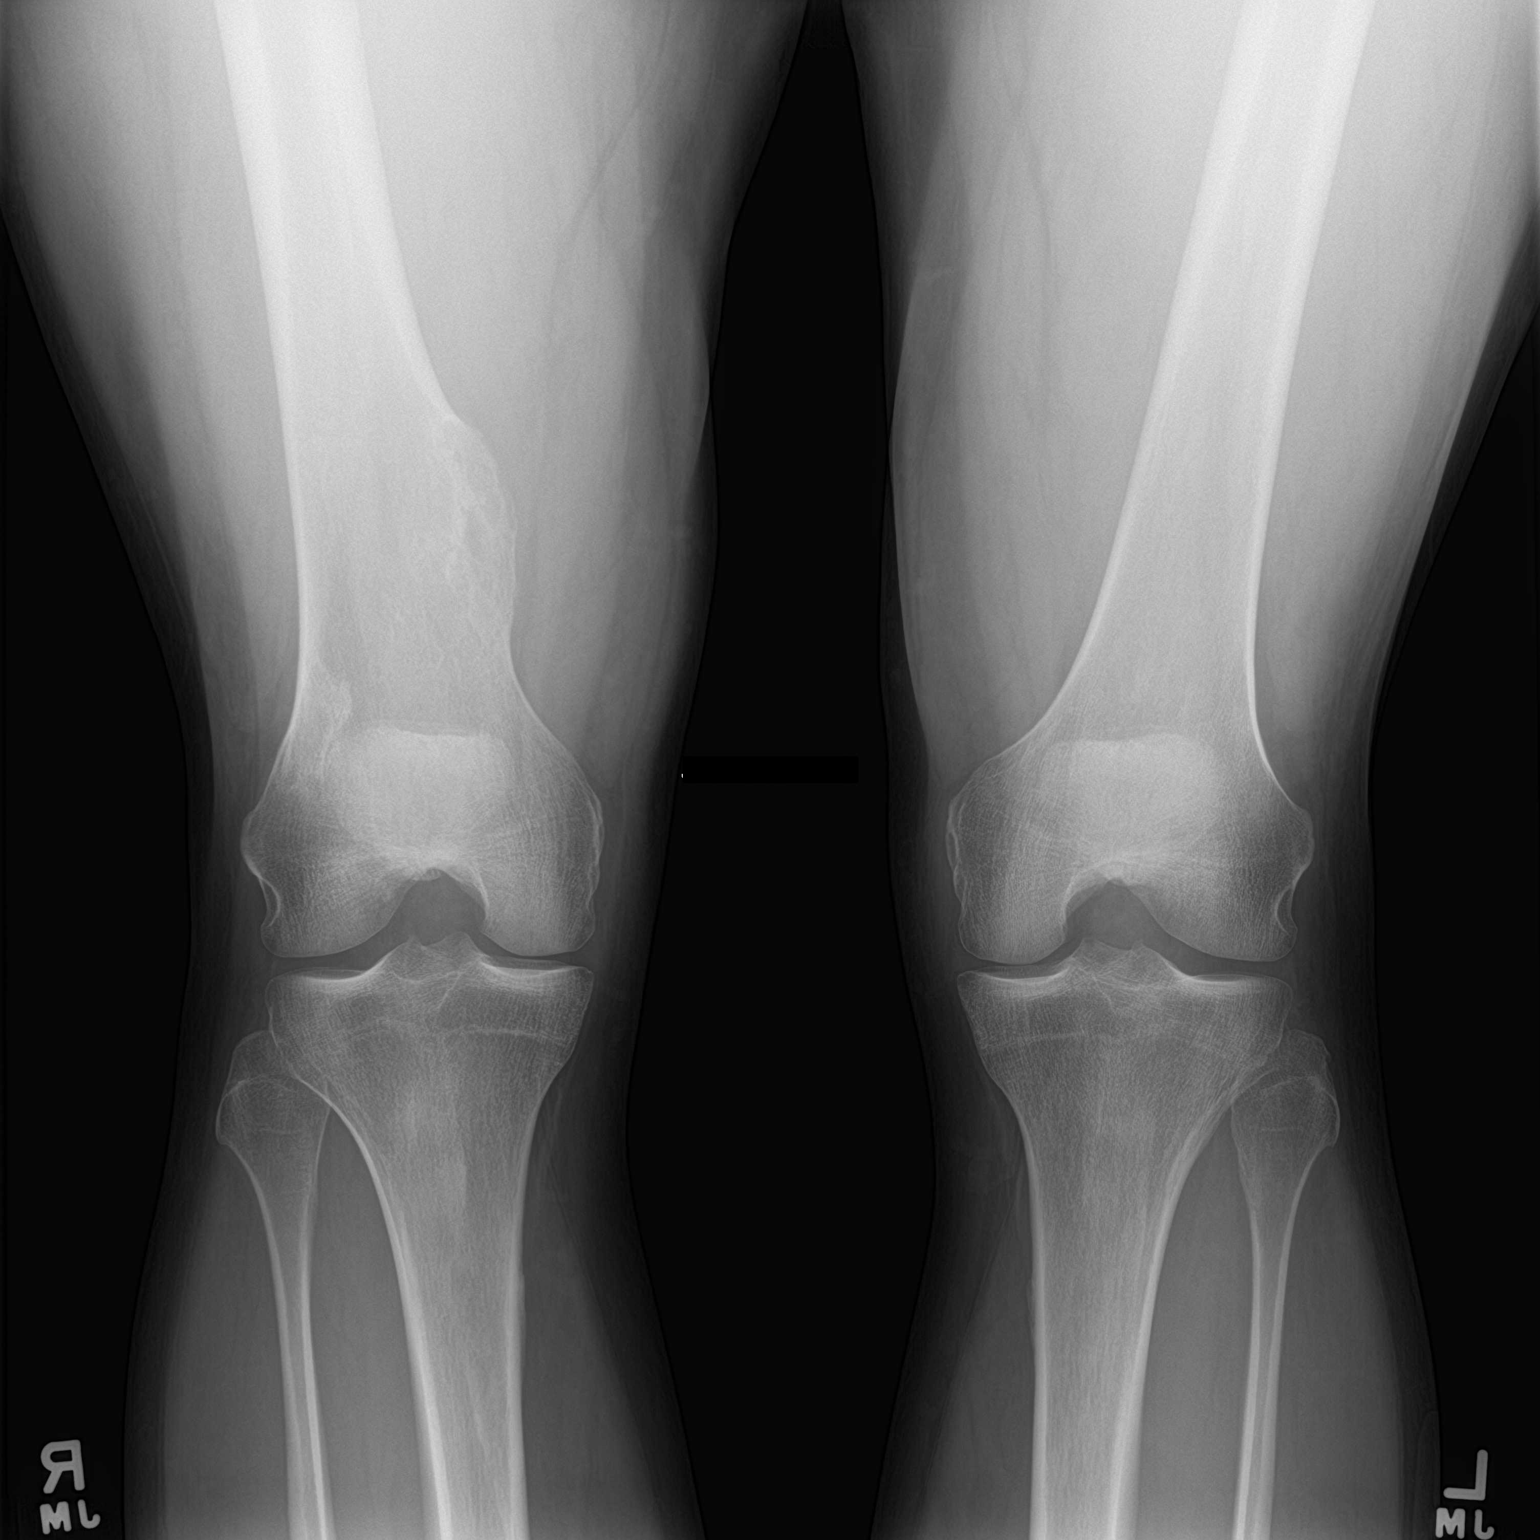

[2 of 2 positions shown; findings below may reference images not displayed]

FINDINGS: Bones: No fracture or dislocation. Normal bone mineralization. No
periostitis.

Large broad bony protrusion along the medial distal femoral
diametaphysis most consistent with a sessile osteochondroma. No bone
destruction or periosteal reaction.

Joints: Normal alignment. No erosive changes. Joint spaces are
maintained.

Soft tissue: No soft tissue abnormality. No radiopaque foreign body.
No chondrocalcinosis.
IMPRESSION: No significant arthropathy of the right knee.

Sessile osteochondroma of the distal medial femoral diametaphysis.

## 2021-05-15 ENCOUNTER — Other Ambulatory Visit: Payer: Self-pay

## 2021-05-15 DIAGNOSIS — E782 Mixed hyperlipidemia: Secondary | ICD-10-CM

## 2021-05-15 DIAGNOSIS — M1A00X0 Idiopathic chronic gout, unspecified site, without tophus (tophi): Secondary | ICD-10-CM

## 2021-05-15 LAB — LIPID PANEL
Cholesterol: 168 mg/dL (ref <200)
HDL: 28 mg/dL — ABNORMAL LOW (ref >=40)
LDL Cholesterol (Calc): 106 mg/dL (calc) — ABNORMAL HIGH
Non-HDL Cholesterol (Calc): 140 mg/dL (calc) — ABNORMAL HIGH (ref <130)
Total CHOL/HDL Ratio: 6 (calc) — ABNORMAL HIGH (ref <5.0)
Triglycerides: 216 mg/dL — ABNORMAL HIGH (ref <150)

## 2021-05-15 LAB — CBC
HCT: 45.5 % (ref 38.5–50.0)
Hemoglobin: 15.8 g/dL (ref 13.2–17.1)
MCH: 29.6 pg (ref 27.0–33.0)
MCHC: 34.7 g/dL (ref 32.0–36.0)
MCV: 85.2 fL (ref 80.0–100.0)
MPV: 10.4 fL (ref 7.5–12.5)
Platelets: 223 10*3/uL (ref 140–400)
RBC: 5.34 10*6/uL (ref 4.20–5.80)
RDW: 12.8 % (ref 11.0–15.0)
WBC: 6.6 10*3/uL (ref 3.8–10.8)

## 2021-05-15 LAB — COMPLETE METABOLIC PANEL WITH GFR
AG Ratio: 1.9 (calc) (ref 1.0–2.5)
ALT: 37 U/L (ref 9–46)
AST: 36 U/L (ref 10–40)
Albumin: 4.5 g/dL (ref 3.6–5.1)
Alkaline phosphatase (APISO): 54 U/L (ref 36–130)
BUN: 18 mg/dL (ref 7–25)
CO2: 28 mmol/L (ref 20–32)
Calcium: 9.2 mg/dL (ref 8.6–10.3)
Chloride: 105 mmol/L (ref 98–110)
Creat: 1.06 mg/dL (ref 0.60–1.29)
Globulin: 2.4 g/dL (calc) (ref 1.9–3.7)
Glucose, Bld: 107 mg/dL — ABNORMAL HIGH (ref 65–99)
Potassium: 4.1 mmol/L (ref 3.5–5.3)
Sodium: 140 mmol/L (ref 135–146)
Total Bilirubin: 0.6 mg/dL (ref 0.2–1.2)
Total Protein: 6.9 g/dL (ref 6.1–8.1)
eGFR: 89 mL/min/{1.73_m2} (ref >=60)

## 2021-05-15 LAB — URIC ACID: Uric Acid, Serum: 6.3 mg/dL (ref 4.0–8.0)

## 2021-05-15 MED ORDER — GEMFIBROZIL 600 MG PO TABS: 600.0000 mg | ORAL_TABLET | Freq: Two times a day (BID) | ORAL | 2 refills | Status: DC

## 2021-05-15 MED ORDER — ICOSAPENT ETHYL 1 G PO CAPS: 2.0000 g | ORAL_CAPSULE | Freq: Two times a day (BID) | ORAL | 1 refills | Status: DC

## 2021-05-15 MED ORDER — ALLOPURINOL 300 MG PO TABS
300.0000 mg | ORAL_TABLET | Freq: Every day | ORAL | 1 refills | Status: DC
Start: 2021-05-15 — End: 2021-11-05

## 2021-09-07 ENCOUNTER — Other Ambulatory Visit: Payer: Self-pay | Admitting: Medical-Surgical

## 2021-09-07 DIAGNOSIS — E782 Mixed hyperlipidemia: Secondary | ICD-10-CM

## 2021-11-04 ENCOUNTER — Other Ambulatory Visit: Payer: Self-pay | Admitting: Medical-Surgical

## 2021-11-04 DIAGNOSIS — M1A00X0 Idiopathic chronic gout, unspecified site, without tophus (tophi): Secondary | ICD-10-CM

## 2021-11-09 NOTE — Progress Notes (Unsigned)
°  HPI with pertinent ROS:   CC:   HPI:   I reviewed the past medical history, family history, social history, surgical history, and allergies today and no changes were needed.  Please see the problem list section below in epic for further details.   Physical exam:   General: Well Developed, well nourished, and in no acute distress.  Neuro: Alert and oriented x3, extra-ocular muscles intact, sensation grossly intact.  HEENT: Normocephalic, atraumatic, pupils equal round reactive to light, neck supple, no masses, no lymphadenopathy, thyroid nonpalpable.  Skin: Warm and dry, no rashes. Cardiac: Regular rate and rhythm, no murmurs rubs or gallops, no lower extremity edema.  Respiratory: Clear to auscultation bilaterally. Not using accessory muscles, speaking in full sentences.  Impression and Recommendations:    1. Mixed hyperlipidemia ***  2. Idiopathic chronic gout without tophus, unspecified site ***   No follow-ups on file. ___________________________________________ Clearnce Sorrel, DNP, APRN, FNP-BC Primary [facility] and [place]

## 2021-11-10 ENCOUNTER — Encounter: Payer: Self-pay | Admitting: Medical-Surgical

## 2021-11-10 ENCOUNTER — Other Ambulatory Visit: Payer: Self-pay

## 2021-11-10 ENCOUNTER — Ambulatory Visit (INDEPENDENT_AMBULATORY_CARE_PROVIDER_SITE_OTHER): Payer: BC Managed Care – PPO | Admitting: Medical-Surgical

## 2021-11-10 VITALS — BP 108/76 | HR 83 | Resp 20 | Ht 75.0 in | Wt 233.5 lb

## 2021-11-10 DIAGNOSIS — E782 Mixed hyperlipidemia: Secondary | ICD-10-CM

## 2021-11-10 DIAGNOSIS — M1A00X0 Idiopathic chronic gout, unspecified site, without tophus (tophi): Secondary | ICD-10-CM

## 2021-11-10 DIAGNOSIS — R7401 Elevation of levels of liver transaminase levels: Secondary | ICD-10-CM | POA: Diagnosis not present

## 2021-11-10 MED ORDER — ICOSAPENT ETHYL 1 G PO CAPS: 2.0000 g | ORAL_CAPSULE | Freq: Two times a day (BID) | ORAL | 3 refills | Status: DC

## 2021-11-10 MED ORDER — GEMFIBROZIL 600 MG PO TABS: 600.0000 mg | ORAL_TABLET | Freq: Two times a day (BID) | ORAL | 3 refills | Status: DC

## 2021-11-10 NOTE — Progress Notes (Signed)
Medical screening examination/treatment was performed by qualified nurse practitioner student and as supervising provider I was immediately available for consultation/collaboration. I have reviewed documentation and agree with assessment and plan. ° °Aliviya Schoeller L. Joice Nazario, DNP, APRN, FNP-BC °[facility] [place] °Primary Care and Sports Medicine ° °

## 2021-11-11 LAB — COMPLETE METABOLIC PANEL WITH GFR
AG Ratio: 1.7 (calc) (ref 1.0–2.5)
ALT: 46 U/L (ref 9–46)
AST: 71 U/L — ABNORMAL HIGH (ref 10–40)
Albumin: 4.6 g/dL (ref 3.6–5.1)
Alkaline phosphatase (APISO): 56 U/L (ref 36–130)
BUN: 25 mg/dL (ref 7–25)
CO2: 24 mmol/L (ref 20–32)
Calcium: 9.6 mg/dL (ref 8.6–10.3)
Chloride: 107 mmol/L (ref 98–110)
Creat: 1.08 mg/dL (ref 0.60–1.29)
Globulin: 2.7 g/dL (calc) (ref 1.9–3.7)
Glucose, Bld: 110 mg/dL — ABNORMAL HIGH (ref 65–99)
Potassium: 4.4 mmol/L (ref 3.5–5.3)
Sodium: 141 mmol/L (ref 135–146)
Total Bilirubin: 0.7 mg/dL (ref 0.2–1.2)
Total Protein: 7.3 g/dL (ref 6.1–8.1)
eGFR: 86 mL/min/{1.73_m2} (ref >=60)

## 2021-11-11 LAB — LIPID PANEL
Cholesterol: 196 mg/dL (ref <200)
HDL: 27 mg/dL — ABNORMAL LOW (ref >=40)
LDL Cholesterol (Calc): 134 mg/dL (calc) — ABNORMAL HIGH
Non-HDL Cholesterol (Calc): 169 mg/dL (calc) — ABNORMAL HIGH (ref <130)
Total CHOL/HDL Ratio: 7.3 (calc) — ABNORMAL HIGH (ref <5.0)
Triglycerides: 208 mg/dL — ABNORMAL HIGH (ref <150)

## 2021-11-11 LAB — URIC ACID: Uric Acid, Serum: 7 mg/dL (ref 4.0–8.0)

## 2021-11-11 NOTE — Addendum Note (Signed)
Addended bySamuel Bouche on: 11/11/2021 01:28 PM ? ? Modules accepted: Orders ? ?

## 2021-12-08 ENCOUNTER — Other Ambulatory Visit: Payer: Self-pay | Admitting: Medical-Surgical

## 2021-12-08 DIAGNOSIS — E782 Mixed hyperlipidemia: Secondary | ICD-10-CM

## 2021-12-27 ENCOUNTER — Other Ambulatory Visit: Payer: Self-pay | Admitting: Medical-Surgical

## 2021-12-27 DIAGNOSIS — M1A00X0 Idiopathic chronic gout, unspecified site, without tophus (tophi): Secondary | ICD-10-CM

## 2021-12-27 MED ORDER — ALLOPURINOL 300 MG PO TABS: 300.0000 mg | ORAL_TABLET | Freq: Every day | ORAL | 3 refills | Status: DC

## 2022-03-09 NOTE — Progress Notes (Unsigned)
   Complete physical exam  Patient: Charles Novak   DOB: Sep 22, 1975   46 y.o. Male  MRN: [id number]  Subjective:    No chief complaint on file.   Kenry Daubert is a 46 y.o. male who presents today for a complete physical exam. He reports consuming a {diet types:17450} diet. {types:19826} He generally feels {DESC; WELL/FAIRLY WELL/POORLY:18703}. He reports sleeping {DESC; WELL/FAIRLY WELL/POORLY:18703}. He {does/does not:200015} have additional problems to discuss today.    Most recent fall risk assessment:    09/18/2019    8:07 AM  [place] in the past year? 0  Injury with Fall? 0     Most recent depression screenings:    05/13/2021    8:38 AM 09/18/2019    8:07 AM  PHQ 2/9 Scores  PHQ - 2 Score 0 0  PHQ- 9 Score  2    {VISON DENTAL STD PSA (Optional):27386}  {History (Optional):23778}  Patient Care Team: Samuel Bouche, NP as PCP - General (Nurse Practitioner)   Outpatient Medications Prior to Visit  Medication Sig   allopurinol (ZYLOPRIM) 300 MG tablet Take 1 tablet (300 mg total) by mouth daily.   balsalazide (COLAZAL) 750 MG capsule Take 3 capsules (2,250 mg total) by mouth 3 (three) times daily.   gemfibrozil (LOPID) 600 MG tablet Take 1 tablet (600 mg total) by mouth 2 (two) times daily before a meal.   icosapent Ethyl (VASCEPA) 1 g capsule Take 2 capsules (2 g total) by mouth 2 (two) times daily. TAKE 2 CAPSULES (2 G TOTAL) BY MOUTH 2 (TWO) TIMES DAILY.   loratadine (CLARITIN) 10 MG tablet Take 1 tablet by mouth daily.   Multiple Vitamin (MULTI-VITAMIN) tablet Take 1 tablet by mouth daily.   pantoprazole (PROTONIX) 40 MG tablet Take 1 tablet (40 mg total) by mouth daily.   No facility-administered medications prior to visit.    ROS        Objective:     There were no vitals taken for this visit. {Vitals History (Optional):23777}  Physical Exam   No results found for any visits on 03/10/22. {Show previous labs (optional):23779}    Assessment  & Plan:    Routine Health Maintenance and Physical Exam  Immunization History  Administered Date(s) Administered   PFIZER(Purple Top)SARS-COV-2 Vaccination 11/30/2019, 12/24/2019   Tdap 06/06/2017    Health Maintenance  Topic Date Due   COLONOSCOPY (Pts 45-56yr Insurance coverage will need to be confirmed)  07/22/2023   TETANUS/TDAP  06/07/2027   Hepatitis C Screening  Completed   HIV Screening  Completed   HPV VACCINES  Aged Out   INFLUENZA VACCINE  Discontinued   COVID-19 Vaccine  Discontinued    Discussed health benefits of physical activity, and encouraged him to engage in regular exercise appropriate for his age and condition.  Problem List Items Addressed This Visit       Other   Hyperlipidemia (Chronic)   History of iron deficiency   Transaminitis   Other Visit Diagnoses     Annual physical exam    -  Primary      No follow-ups on file.     JSamuel Bouche NP

## 2022-03-10 ENCOUNTER — Ambulatory Visit (INDEPENDENT_AMBULATORY_CARE_PROVIDER_SITE_OTHER): Payer: BC Managed Care – PPO | Admitting: Medical-Surgical

## 2022-03-10 ENCOUNTER — Encounter: Payer: Self-pay | Admitting: Medical-Surgical

## 2022-03-10 VITALS — BP 131/84 | HR 68 | Ht 75.0 in | Wt 230.0 lb

## 2022-03-10 DIAGNOSIS — R7401 Elevation of levels of liver transaminase levels: Secondary | ICD-10-CM | POA: Diagnosis not present

## 2022-03-10 DIAGNOSIS — E782 Mixed hyperlipidemia: Secondary | ICD-10-CM

## 2022-03-10 DIAGNOSIS — Z8639 Personal history of other endocrine, nutritional and metabolic disease: Secondary | ICD-10-CM | POA: Diagnosis not present

## 2022-03-10 DIAGNOSIS — Z0000 Encounter for general adult medical examination without abnormal findings: Secondary | ICD-10-CM | POA: Diagnosis not present

## 2022-03-10 DIAGNOSIS — Z125 Encounter for screening for malignant neoplasm of prostate: Secondary | ICD-10-CM

## 2022-03-11 LAB — COMPLETE METABOLIC PANEL WITH GFR
AG Ratio: 2.1 (calc) (ref 1.0–2.5)
ALT: 60 U/L — ABNORMAL HIGH (ref 9–46)
AST: 122 U/L — ABNORMAL HIGH (ref 10–40)
Albumin: 4.8 g/dL (ref 3.6–5.1)
Alkaline phosphatase (APISO): 56 U/L (ref 36–130)
BUN: 20 mg/dL (ref 7–25)
CO2: 23 mmol/L (ref 20–32)
Calcium: 9.6 mg/dL (ref 8.6–10.3)
Chloride: 104 mmol/L (ref 98–110)
Creat: 0.97 mg/dL (ref 0.60–1.29)
Globulin: 2.3 g/dL (calc) (ref 1.9–3.7)
Glucose, Bld: 99 mg/dL (ref 65–99)
Potassium: 4.7 mmol/L (ref 3.5–5.3)
Sodium: 138 mmol/L (ref 135–146)
Total Bilirubin: 0.7 mg/dL (ref 0.2–1.2)
Total Protein: 7.1 g/dL (ref 6.1–8.1)
eGFR: 98 mL/min/{1.73_m2} (ref >=60)

## 2022-03-11 LAB — CBC WITH DIFFERENTIAL/PLATELET
Absolute Monocytes: 390 cells/uL (ref 200–950)
Basophils Absolute: 49 cells/uL (ref 0–200)
Basophils Relative: 0.8 %
Eosinophils Absolute: 250 cells/uL (ref 15–500)
Eosinophils Relative: 4.1 %
HCT: 45.3 % (ref 38.5–50.0)
Hemoglobin: 15.6 g/dL (ref 13.2–17.1)
Lymphs Abs: 2251 cells/uL (ref 850–3900)
MCH: 29.7 pg (ref 27.0–33.0)
MCHC: 34.4 g/dL (ref 32.0–36.0)
MCV: 86.3 fL (ref 80.0–100.0)
MPV: 10.5 fL (ref 7.5–12.5)
Monocytes Relative: 6.4 %
Neutro Abs: 3160 cells/uL (ref 1500–7800)
Neutrophils Relative %: 51.8 %
Platelets: 211 10*3/uL (ref 140–400)
RBC: 5.25 10*6/uL (ref 4.20–5.80)
RDW: 13.1 % (ref 11.0–15.0)
Total Lymphocyte: 36.9 %
WBC: 6.1 10*3/uL (ref 3.8–10.8)

## 2022-03-11 LAB — LIPID PANEL
Cholesterol: 189 mg/dL (ref <200)
HDL: 27 mg/dL — ABNORMAL LOW (ref >=40)
LDL Cholesterol (Calc): 118 mg/dL (calc) — ABNORMAL HIGH
Non-HDL Cholesterol (Calc): 162 mg/dL (calc) — ABNORMAL HIGH (ref <130)
Total CHOL/HDL Ratio: 7 (calc) — ABNORMAL HIGH (ref <5.0)
Triglycerides: 287 mg/dL — ABNORMAL HIGH (ref <150)

## 2022-03-11 LAB — PSA: PSA: 0.65 ng/mL (ref <=4.00)

## 2022-03-11 LAB — IRON,TIBC AND FERRITIN PANEL
%SAT: 36 % (calc) (ref 20–48)
Ferritin: 166 ng/mL (ref 38–380)
Iron: 138 ug/dL (ref 50–180)
TIBC: 385 mcg/dL (calc) (ref 250–425)

## 2022-09-13 ENCOUNTER — Other Ambulatory Visit: Payer: Self-pay | Admitting: Medical-Surgical

## 2022-09-13 DIAGNOSIS — E782 Mixed hyperlipidemia: Secondary | ICD-10-CM

## 2022-10-16 ENCOUNTER — Other Ambulatory Visit: Payer: Self-pay | Admitting: Medical-Surgical

## 2022-10-16 DIAGNOSIS — M1A00X0 Idiopathic chronic gout, unspecified site, without tophus (tophi): Secondary | ICD-10-CM

## 2022-10-16 DIAGNOSIS — E782 Mixed hyperlipidemia: Secondary | ICD-10-CM

## 2022-10-20 ENCOUNTER — Other Ambulatory Visit: Payer: Self-pay

## 2023-01-06 ENCOUNTER — Other Ambulatory Visit: Payer: Self-pay | Admitting: Medical-Surgical

## 2023-01-06 DIAGNOSIS — M1A00X0 Idiopathic chronic gout, unspecified site, without tophus (tophi): Secondary | ICD-10-CM

## 2023-03-03 ENCOUNTER — Other Ambulatory Visit: Payer: Self-pay | Admitting: Medical-Surgical

## 2023-03-03 DIAGNOSIS — M1A00X0 Idiopathic chronic gout, unspecified site, without tophus (tophi): Secondary | ICD-10-CM

## 2023-06-08 ENCOUNTER — Other Ambulatory Visit: Payer: Self-pay | Admitting: Medical-Surgical

## 2023-06-08 DIAGNOSIS — M1A00X0 Idiopathic chronic gout, unspecified site, without tophus (tophi): Secondary | ICD-10-CM

## 2023-07-25 LAB — HM COLONOSCOPY

## 2023-08-03 ENCOUNTER — Other Ambulatory Visit: Payer: Self-pay | Admitting: Medical-Surgical

## 2023-08-03 DIAGNOSIS — M1A00X0 Idiopathic chronic gout, unspecified site, without tophus (tophi): Secondary | ICD-10-CM

## 2023-10-10 ENCOUNTER — Other Ambulatory Visit: Payer: Self-pay | Admitting: Medical-Surgical

## 2023-10-10 DIAGNOSIS — M1A00X0 Idiopathic chronic gout, unspecified site, without tophus (tophi): Secondary | ICD-10-CM

## 2024-03-16 ENCOUNTER — Other Ambulatory Visit: Payer: Self-pay | Admitting: Medical-Surgical

## 2024-03-16 DIAGNOSIS — M1A00X0 Idiopathic chronic gout, unspecified site, without tophus (tophi): Secondary | ICD-10-CM

## 2024-04-02 ENCOUNTER — Other Ambulatory Visit: Payer: Self-pay | Admitting: Medical Genetics

## 2024-04-09 ENCOUNTER — Encounter: Payer: Self-pay | Admitting: Medical-Surgical

## 2024-04-09 ENCOUNTER — Ambulatory Visit (INDEPENDENT_AMBULATORY_CARE_PROVIDER_SITE_OTHER): Admitting: Medical-Surgical

## 2024-04-09 VITALS — BP 113/75 | HR 73 | Resp 20 | Ht 75.0 in | Wt 243.0 lb

## 2024-04-09 DIAGNOSIS — Z125 Encounter for screening for malignant neoplasm of prostate: Secondary | ICD-10-CM | POA: Diagnosis not present

## 2024-04-09 DIAGNOSIS — Z0000 Encounter for general adult medical examination without abnormal findings: Secondary | ICD-10-CM | POA: Diagnosis not present

## 2024-04-09 DIAGNOSIS — R0683 Snoring: Secondary | ICD-10-CM

## 2024-04-09 DIAGNOSIS — R5383 Other fatigue: Secondary | ICD-10-CM

## 2024-04-09 DIAGNOSIS — M1A00X0 Idiopathic chronic gout, unspecified site, without tophus (tophi): Secondary | ICD-10-CM | POA: Diagnosis not present

## 2024-04-09 DIAGNOSIS — E782 Mixed hyperlipidemia: Secondary | ICD-10-CM

## 2024-04-09 MED ORDER — ALLOPURINOL 300 MG PO TABS: 300.0000 mg | ORAL_TABLET | Freq: Every day | ORAL | 3 refills | Status: DC

## 2024-04-09 MED ORDER — ICOSAPENT ETHYL 1 G PO CAPS: 2.0000 g | ORAL_CAPSULE | Freq: Two times a day (BID) | ORAL | 3 refills | Status: DC

## 2024-04-09 MED ORDER — GEMFIBROZIL 600 MG PO TABS: 600.0000 mg | ORAL_TABLET | Freq: Two times a day (BID) | ORAL | 1 refills | Status: DC

## 2024-04-09 NOTE — Progress Notes (Signed)
 Complete physical exam  Patient: Charles Novak   DOB: August 01, 1976   48 y.o. Male  MRN: [id number]  Subjective:    Chief Complaint  Patient presents with   Annual Exam    Charles Novak is a 48 y.o. male who presents today for a complete physical exam. He reports consuming a general diet. The patient does not participate in regular exercise at present. He generally feels fairly well. He reports sleeping poorly. He does not have additional problems to discuss today.    Most recent fall risk assessment:    04/09/2024    3:01 PM  Fall Risk   Falls in the past year? 0  Number falls in past yr: 0  Injury with Fall? 0  Risk for fall due to : No Fall Risks  Follow up Falls evaluation completed     Most recent depression screenings:    04/09/2024    3:01 PM 03/10/2022    8:42 AM  PHQ 2/9 Scores  PHQ - 2 Score 0 0  PHQ- 9 Score  0    Vision:Within last year, Dental: No current dental problems and Receives regular dental care, STD: The patient denies history of sexually transmitted disease., and PSA: Prostate cancer screening and PSA options (with potential risks and benefits of testing vs. not testing) were discussed along with recent recs/guidelines.     Patient Care Team: Willo Mini, NP as PCP - General (Nurse Practitioner)   Outpatient Medications Prior to Visit  Medication Sig   balsalazide (COLAZAL) 750 MG capsule Take 3 capsules (2,250 mg total) by mouth 3 (three) times daily.   loratadine (CLARITIN) 10 MG tablet Take 1 tablet by mouth daily.   Multiple Vitamin (MULTI-VITAMIN) tablet Take 1 tablet by mouth daily.   omeprazole (PRILOSEC) 20 MG capsule Take 20 mg by mouth daily.   [DISCONTINUED] allopurinol  (ZYLOPRIM ) 300 MG tablet Take 1 tablet (300 mg total) by mouth daily. NEEDS APPOINTMENT FOR FURTHER REFILLS.   [DISCONTINUED] gemfibrozil  (LOPID ) 600 MG tablet Take 1 tablet (600 mg total) by mouth 2 (two) times daily before a meal. NEEDS AN APPOINTMENT FOR FURTHER  REFILLS.   [DISCONTINUED] icosapent  Ethyl (VASCEPA ) 1 g capsule TAKE 2 CAPSULES BY MOUTH TWICE  DAILY   [DISCONTINUED] pantoprazole (PROTONIX) 40 MG tablet Take 1 tablet (40 mg total) by mouth daily.   No facility-administered medications prior to visit.    Review of Systems  Constitutional:  Positive for malaise/fatigue.  HENT:  Positive for congestion.   Respiratory:  Positive for cough and sputum production (clear). Negative for shortness of breath and wheezing.   Cardiovascular:  Positive for leg swelling (by the end of the day).  Gastrointestinal:  Positive for abdominal pain, diarrhea and heartburn. Negative for blood in stool, constipation, melena, nausea and vomiting.  Genitourinary: Negative.   Musculoskeletal:  Positive for joint pain.  Neurological:  Negative for dizziness, tingling, weakness and headaches.  Endo/Heme/Allergies:  Positive for environmental allergies.  Psychiatric/Behavioral:  Negative for depression, substance abuse and suicidal ideas. The patient is nervous/anxious and has insomnia.      Objective:    BP 113/75 (BP Location: Left Arm, Cuff Size: Normal)   Pulse 73   Resp 20   Ht 6' 3 (1.905 m)   Wt 243 lb (110.2 kg)   SpO2 99%   BMI 30.37 kg/m    Physical Exam Vitals reviewed.  Constitutional:      General: He is not in acute distress.    Appearance: Normal  appearance. He is not ill-appearing.  HENT:     Head: Normocephalic and atraumatic.     Right Ear: Tympanic membrane, ear canal and external ear normal. There is no impacted cerumen.     Left Ear: Tympanic membrane, ear canal and external ear normal. There is no impacted cerumen.     Nose: Nose normal. No congestion or rhinorrhea.     Mouth/Throat:     Mouth: Mucous membranes are moist.     Pharynx: No oropharyngeal exudate or posterior oropharyngeal erythema.  Eyes:     General: No scleral icterus.       Right eye: No discharge.        Left eye: No discharge.     Extraocular Movements:  Extraocular movements intact.     Conjunctiva/sclera: Conjunctivae normal.     Pupils: Pupils are equal, round, and reactive to light.  Neck:     Thyroid : No thyromegaly.     Vascular: No carotid bruit or JVD.     Trachea: Trachea normal.  Cardiovascular:     Rate and Rhythm: Normal rate and regular rhythm.     Pulses: Normal pulses.     Heart sounds: Normal heart sounds. No murmur heard.    No friction rub. No gallop.  Pulmonary:     Effort: Pulmonary effort is normal. No respiratory distress.     Breath sounds: Normal breath sounds. No wheezing.  Abdominal:     General: Bowel sounds are normal. There is no distension.     Palpations: Abdomen is soft.     Tenderness: There is no abdominal tenderness. There is no guarding.  Musculoskeletal:        General: Normal range of motion.     Cervical back: Normal range of motion and neck supple.  Lymphadenopathy:     Cervical: No cervical adenopathy.  Skin:    General: Skin is warm and dry.  Neurological:     Mental Status: He is alert and oriented to person, place, and time.     Cranial Nerves: No cranial nerve deficit.  Psychiatric:        Mood and Affect: Mood normal.        Behavior: Behavior normal.        Thought Content: Thought content normal.        Judgment: Judgment normal.      Results for orders placed or performed in visit on 04/09/24  HM COLONOSCOPY  Result Value Ref Range   HM Colonoscopy See Report (in chart) See Report (in chart), Patient Reported       Assessment & Plan:    Routine Health Maintenance and Physical Exam  Immunization History  Administered Date(s) Administered   PFIZER(Purple Top)SARS-COV-2 Vaccination 11/30/2019, 12/24/2019   Tdap 06/06/2017    Health Maintenance  Topic Date Due   Hepatitis B Vaccines (1 of 3 - 19+ 3-dose series) Never done   INFLUENZA VACCINE  11/27/2024 (Originally 03/30/2024)   Colonoscopy  07/24/2026   DTaP/Tdap/Td (2 - Td or Tdap) 06/07/2027   Hepatitis C  Screening  Completed   HIV Screening  Completed   HPV VACCINES  Aged Out   Meningococcal B Vaccine  Aged Out   COVID-19 Vaccine  Discontinued    Discussed health benefits of physical activity, and encouraged him to engage in regular exercise appropriate for his age and condition.  1. Annual physical exam (Primary) Checking labs as below. UTD on preventative care. Wellness information provided with AVS. - CBC with  Differential/Platelet - CMP14+EGFR  2. Mixed hyperlipidemia Checking lipids. Restart Vascepa  and Lopid  as prescribed.  - Lipid panel - icosapent  Ethyl (VASCEPA ) 1 g capsule; Take 2 capsules (2 g total) by mouth 2 (two) times daily.  Dispense: 360 capsule; Refill: 3 - gemfibrozil  (LOPID ) 600 MG tablet; Take 1 tablet (600 mg total) by mouth 2 (two) times daily before a meal.  Dispense: 180 tablet; Refill: 1  3. Idiopathic chronic gout without tophus, unspecified site Checking uric acid. Continue Allopurinol .  - Uric acid - allopurinol  (ZYLOPRIM ) 300 MG tablet; Take 1 tablet (300 mg total) by mouth daily.  Dispense: 90 tablet; Refill: 3  4. Prostate cancer screening Checking PSA. - PSA Total (Reflex To Free)  5. Fatigue, unspecified type 6. Loud snoring Continues to experience fatigue along with mild snoring.  Checking testosterone  in conjunction with other labs.  Ordering home sleep study for further evaluation. - Testosterone  - Home sleep test; Future  Return in about 1 year (around 04/09/2025) for annual physical exam or sooner if needed.   Salote Weidmann, NP

## 2024-04-09 NOTE — Patient Instructions (Signed)

## 2024-04-10 ENCOUNTER — Ambulatory Visit: Payer: Self-pay | Admitting: Medical-Surgical

## 2024-04-10 DIAGNOSIS — E781 Pure hyperglyceridemia: Secondary | ICD-10-CM

## 2024-04-10 DIAGNOSIS — E782 Mixed hyperlipidemia: Secondary | ICD-10-CM

## 2024-04-10 DIAGNOSIS — M1A00X0 Idiopathic chronic gout, unspecified site, without tophus (tophi): Secondary | ICD-10-CM

## 2024-04-10 LAB — CBC WITH DIFFERENTIAL/PLATELET
Basophils Absolute: 0.1 x10E3/uL (ref 0.0–0.2)
Basos: 1 %
EOS (ABSOLUTE): 0.2 x10E3/uL (ref 0.0–0.4)
Eos: 3 %
Hematocrit: 48.7 % (ref 37.5–51.0)
Hemoglobin: 16.6 g/dL (ref 13.0–17.7)
Immature Grans (Abs): 0 x10E3/uL (ref 0.0–0.1)
Immature Granulocytes: 0 %
Lymphocytes Absolute: 2.8 x10E3/uL (ref 0.7–3.1)
Lymphs: 37 %
MCH: 29.6 pg (ref 26.6–33.0)
MCHC: 34.1 g/dL (ref 31.5–35.7)
MCV: 87 fL (ref 79–97)
Monocytes Absolute: 0.4 x10E3/uL (ref 0.1–0.9)
Monocytes: 6 %
Neutrophils Absolute: 4 x10E3/uL (ref 1.4–7.0)
Neutrophils: 53 %
Platelets: 190 x10E3/uL (ref 150–450)
RBC: 5.6 x10E6/uL (ref 4.14–5.80)
RDW: 13.8 % (ref 11.6–15.4)
WBC: 7.6 x10E3/uL (ref 3.4–10.8)

## 2024-04-10 LAB — CMP14+EGFR
ALT: 71 IU/L — ABNORMAL HIGH (ref 0–44)
AST: 92 IU/L — ABNORMAL HIGH (ref 0–40)
Albumin: 4.5 g/dL (ref 4.1–5.1)
Alkaline Phosphatase: 88 IU/L (ref 44–121)
BUN/Creatinine Ratio: 21 — ABNORMAL HIGH (ref 9–20)
BUN: 19 mg/dL (ref 6–24)
Bilirubin Total: 0.6 mg/dL (ref 0.0–1.2)
CO2: 21 mmol/L (ref 20–29)
Calcium: 10.1 mg/dL (ref 8.7–10.2)
Chloride: 101 mmol/L (ref 96–106)
Creatinine, Ser: 0.89 mg/dL (ref 0.76–1.27)
Globulin, Total: 2.7 g/dL (ref 1.5–4.5)
Glucose: 92 mg/dL (ref 70–99)
Potassium: 4.2 mmol/L (ref 3.5–5.2)
Sodium: 137 mmol/L (ref 134–144)
Total Protein: 7.2 g/dL (ref 6.0–8.5)
eGFR: 106 mL/min/1.73 (ref >59)

## 2024-04-10 LAB — LIPID PANEL
Chol/HDL Ratio: 8.5 ratio — ABNORMAL HIGH (ref 0.0–5.0)
Cholesterol, Total: 222 mg/dL — ABNORMAL HIGH (ref 100–199)
HDL: 26 mg/dL — ABNORMAL LOW (ref >39)
LDL Chol Calc (NIH): 103 mg/dL — ABNORMAL HIGH (ref 0–99)
Triglycerides: 545 mg/dL — ABNORMAL HIGH (ref 0–149)
VLDL Cholesterol Cal: 93 mg/dL — ABNORMAL HIGH (ref 5–40)

## 2024-04-10 LAB — PSA TOTAL (REFLEX TO FREE): Prostate Specific Ag, Serum: 1 ng/mL (ref 0.0–4.0)

## 2024-04-10 LAB — TESTOSTERONE: Testosterone: 303 ng/dL (ref 264–916)

## 2024-04-10 LAB — URIC ACID: Uric Acid: 7 mg/dL (ref 3.8–8.4)

## 2024-04-10 MED ORDER — ALLOPURINOL 200 MG PO TABS: 400.0000 mg | ORAL_TABLET | Freq: Every day | ORAL | 1 refills | Status: DC

## 2024-04-11 ENCOUNTER — Other Ambulatory Visit: Payer: Self-pay | Admitting: Medical-Surgical

## 2024-04-11 MED ORDER — OMEGA-3-ACID ETHYL ESTERS 1 G PO CAPS
2.0000 g | ORAL_CAPSULE | Freq: Two times a day (BID) | ORAL | 3 refills | Status: AC
Start: 2024-04-11 — End: ?

## 2024-04-11 NOTE — Progress Notes (Signed)
 Vascepa  not covered on current plan. Lovaza  generic showing as covered alternative. Sending Lovaza  in.   ___________________________________________ Zada FREDRIK Palin, DNP, APRN, FNP-BC Primary Care and Sports Medicine [facility] [place]

## 2024-04-18 ENCOUNTER — Other Ambulatory Visit: Payer: Self-pay | Admitting: Medical-Surgical

## 2024-04-18 DIAGNOSIS — M1A00X0 Idiopathic chronic gout, unspecified site, without tophus (tophi): Secondary | ICD-10-CM

## 2024-04-18 MED ORDER — ALLOPURINOL 300 MG PO TABS: 300.0000 mg | ORAL_TABLET | Freq: Every day | ORAL | 3 refills | Status: AC

## 2024-04-18 MED ORDER — ALLOPURINOL 100 MG PO TABS: 100.0000 mg | ORAL_TABLET | Freq: Every day | ORAL | 3 refills | Status: AC

## 2024-06-29 ENCOUNTER — Other Ambulatory Visit: Payer: Self-pay | Admitting: Medical Genetics

## 2024-06-29 DIAGNOSIS — Z006 Encounter for examination for normal comparison and control in clinical research program: Secondary | ICD-10-CM

## 2024-07-18 ENCOUNTER — Other Ambulatory Visit: Payer: Self-pay

## 2024-07-18 DIAGNOSIS — E782 Mixed hyperlipidemia: Secondary | ICD-10-CM

## 2024-07-18 MED ORDER — GEMFIBROZIL 600 MG PO TABS: 600.0000 mg | ORAL_TABLET | Freq: Two times a day (BID) | ORAL | 1 refills | Status: AC
